# Patient Record
Sex: Male | Born: 1980 | Race: White | Hispanic: No | Marital: Single | Smoking: Heavy tobacco smoker
Health system: Western US, Community
[De-identification: ages and names within clinical notes are randomized; demographics above are authoritative.]

## PROBLEM LIST (undated history)

## (undated) HISTORY — PX: TONSILLECTOMY: SUR1361

---

## 2014-04-04 ENCOUNTER — Inpatient Hospital Stay
Admit: 2014-04-04 | Discharge: 2014-04-04 | Disposition: A | Discharge status: Home / Self Care | Payer: PRIVATE HEALTH INSURANCE | Attending: MD

## 2014-04-04 DIAGNOSIS — F1020 Alcohol dependence, uncomplicated: Principal | ICD-10-CM

## 2014-04-04 LAB — CBC WITH AUTO DIFFERENTIAL
Basophils %: 1 % (ref 0–2)
Basophils, Absolute: 0.1 10*3/uL (ref 0–0.2)
Eosinophils %: 6 % (ref 0–7)
Eosinophils, Absolute: 0.5 10*3/uL (ref 0–0.7)
HCT: 44.9 % (ref 42.0–54.0)
Hgb: 15.2 gm/dL (ref 14.0–18.0)
Lymphocytes %: 27 % (ref 25–45)
Lymphocytes, Absolute: 2.1 10*3/uL (ref 1.1–4.3)
MCH: 32 pg (ref 27.0–34.0)
MCHC: 34 gm/dL (ref 32.0–36.0)
MCV: 94.3 fL (ref 81.0–99.0)
MPV: 7.4 fL (ref 7.4–10.4)
Monocytes %: 8 % (ref 0–12)
Monocytes, Absolute: 0.6 10*3/uL (ref 0–1.2)
Neutrophils, Absolute: 4.8 10*3/uL (ref 1.6–7.3)
Platelet Count: 355 10*3/uL (ref 150–400)
RBC: 4.76 10*6/uL (ref 4.70–6.10)
RDW: 14 % (ref 11.5–14.5)
Segs (Neutrophils)%: 58 % (ref 35–70)
WBC: 8.1 10*3/uL (ref 4.8–10.8)

## 2014-04-04 LAB — URINALYSIS, ROUTINE
Bilirubin, urine: NEGATIVE
Blood, urine: NEGATIVE
Glucose, UA: NEGATIVE mg/dl
Ketones, urine: NEGATIVE
Leukocyte Esterase, urine: NEGATIVE
Nitrite, urine: NEGATIVE
Protein, urine: NEGATIVE
Specific Gravity, urine: 1.008 (ref 1.003–1.030)
Urobilinogen, urine: NEGATIVE EU/dL
pH, UA: 5.5 (ref 5.0–8.5)

## 2014-04-04 LAB — COMPREHENSIVE METABOLIC PANEL
ALT - Alanine Amino transferase: 33 IU/L (ref 7–52)
AST - Aspartate Aminotransferase: 34 IU/L (ref 10–50)
Albumin/Globulin Ratio: 1.3 (ref >0.9)
Albumin: 4.2 gm/dL (ref 3.5–5.0)
Alkaline Phosphatase: 83 IU/L (ref 34–104)
Anion Gap: 8 mmol/L (ref 3–11)
BUN: 7 mg/dL (ref 6.0–23.0)
Bilirubin, Total: 0.5 mg/dL (ref 0.3–1.2)
CO2 - Carbon Dioxide: 29 mmol/L (ref 21.0–31.0)
Calcium: 9.7 mg/dL (ref 8.6–10.3)
Chloride: 101 mmol/L (ref 98–111)
Creatinine, Serum: 0.86 mg/dL (ref 0.65–1.30)
GFR Estimate: >60 mL/min/{1.73_m2} (ref >60)
Globulin: 3.2 gm/dL (ref 2.2–3.7)
Glucose: 88 mg/dL (ref 80–99)
Potassium: 4.6 mmol/L (ref 3.5–5.1)
Protein, Total: 7.4 gm/dL (ref 6.0–8.0)
Sodium: 138 mmol/L (ref 135–143)

## 2014-04-04 LAB — DRUG SCREEN PANEL 8
Amphetamine: POSITIVE
Barbiturates: NEGATIVE
Benzodiazepines: POSITIVE
Cocaine: NEGATIVE
Creatinine Drug Screen: 89 mg/dL
Marijuana: POSITIVE
Methadone Screen, urine: NEGATIVE
Opiates: NEGATIVE
Oxycodone: NEGATIVE

## 2014-04-04 LAB — SALICYLATE LEVEL: Salicylate Level: <2.5 mg/dL — ABNORMAL LOW (ref 10.0–30.0)

## 2014-04-04 LAB — ACETAMINOPHEN LEVEL: Acetaminophen Level: <10 microgm/mL — ABNORMAL LOW (ref 10–30)

## 2014-04-04 LAB — ALCOHOL, SERUM: Alcohol Scrn: 76 mg/dL — ABNORMAL HIGH (ref <10)

## 2014-04-04 MED ORDER — LORazepam (ATIVAN) tablet 1 mg
1 | Freq: Once | ORAL | Status: AC
Start: 2014-04-04 — End: 2014-04-04
  Administered 2014-04-04: 16:00:00 1 mg via ORAL

## 2014-04-04 MED FILL — LORAZEPAM 1 MG TABLET: 1 mg | ORAL | Qty: 1

## 2014-04-04 NOTE — ED Notes (Signed)
Pt admits to taking multiple drugs within the last week to include lots of acid, meth, cocaine, pills, and at least 10 beers daily average with additional consumption this week.

## 2014-04-04 NOTE — Discharge Instructions (Signed)
Thank you for coming to [facility] today. You were seen and evaluated for alcohol abuse. Please follow the below recommendations:    - You need to cut back on your alcohol consumption. Alcohol can cause both emergent medical problems as well as tragic long-term complications like liver failure and death. It can also cause serious social problems.    - Alcohol withdrawal can be life threatening. You cannot quit alcohol cold turkey. You will need to gradually taper off of the amount of alcohol you drink or you could have severe withdrawal symptoms.    - Return to the ER if you are having any thoughts of wanting to hurt yourself or others, if you are hearing or seeing things that aren't there, are having severe tremors or any other symptoms of concern.       Thank you.  Falicia Lizotte, MD

## 2014-04-04 NOTE — ED Notes (Signed)
Patient has been walking around for 8 days and has not slept. He feels that he is detoxing from alcohol. Last drink was at at 0200. He states that he is having auditory hallucinations. He denies thoughts or intent of harming himself or others.

## 2014-04-04 NOTE — ED Notes (Signed)
Pt states he is feeling better. Requested urine from pt once again. Pt states he will try to void per urinal.

## 2014-04-04 NOTE — ED Provider Notes (Signed)
History   Dennis Palmer is a 34 y.o. year old male presenting with Medical Sreening  .    HPI     The patient is a 34 year old male who is presenting with possible withdrawal. The patient states that for the past week he has had not slept. He states that he lives in [place], but was planning to go to [place] this weekend but states that did not work out. He cannot tell me why he is in this area. He is not from here. He states that for the past week he has been going to numerous parties and using whatever drugs he can get his hands on. He states that he has used ketamine, LSD, cocaine, meth and various pills. He also states that he intermittently uses IV drugs. He is vague about the last time he used any drugs. He denies any intentional ingestions, suicidal ideations or homicidal ideations. He states that he was at a bar last night and came here after he finished drinking at 2 AM. He walked here. He is worried that he might have some withdrawal symptoms. He states that occasionally he has some auditory hallucinations but they're not telling him to do anything.    The patient denies any recent illness, fevers or chills. He has no headache. He has no chest pain, shortness of breath, nausea or vomiting or abdominal pain.    He is requesting some information to help with his alcohol addiction. He states that he is planning to return to [place] but is vague about when this will be.         History reviewed. No pertinent past medical history.    History reviewed. No pertinent past surgical history.    History reviewed. No pertinent family history.    History   Substance Use Topics   . Smoking status: Heavy Tobacco Smoker   . Smokeless tobacco: Not on file   . Alcohol Use: Yes      Comment: daily   Various drug use - see HPI.       Meds: None      Review of Systems   Complete 10 system review was performed and is otherwise negative apart from that mentioned in HPI.      Physical Exam   BP 133/89 mmHg  Pulse 80  Temp(Src)  36.5 ?C (97.7 ?F) (Oral)  Resp 18  SpO2 99%    Physical Exam   Constitutional: He is oriented to person, place, and time.   Disheveled.    HENT:   Head: Normocephalic and atraumatic.   Nose: Nose normal.   Mouth/Throat: Oropharynx is clear and moist. No oropharyngeal exudate.   Eyes: EOM are normal. Pupils are equal, round, and reactive to light. Right eye exhibits no discharge. Left eye exhibits no discharge.   Neck: Normal range of motion. Neck supple.   Cardiovascular: Normal rate, regular rhythm and intact distal pulses.    No murmur heard.  Pulmonary/Chest: Effort normal and breath sounds normal. No respiratory distress.   Abdominal: Soft. There is no tenderness.   Musculoskeletal: Normal range of motion. He exhibits no edema.   Neurological: He is alert and oriented to person, place, and time. He exhibits normal muscle tone. Coordination normal.   Intermitting shaking his whole body but he is alert during this and this is not true seizure activity.   Strength and sensation is grossly intact.    Skin: No rash noted. He is not diaphoretic.   Psychiatric: His  mood appears anxious. His speech is delayed. He is slowed and withdrawn. He is not actively hallucinating. He expresses no homicidal and no suicidal ideation. He expresses no suicidal plans and no homicidal plans.   Nursing note and vitals reviewed.      ED Course     Results for orders placed or performed during the hospital encounter of 04/04/14 (from the past 24 hour(s))   Alcohol Medical/Serum -STAT    Collection Time: 04/04/14  8:09 AM   Result Value Ref Range    Alcohol Scrn 76 (H) <10 mg/dL   CBC with Auto Differential -STAT    Collection Time: 04/04/14  8:09 AM   Result Value Ref Range    WBC 8.1 4.8 - 10.8 K/microL    RBC 4.76 4.70 - 6.10 M/microL    Hgb 15.2 14.0 - 18.0 gm/dL    HCT 16.1 09.6 - 04.5 %    MCV 94.3 81.0 - 99.0 fL    MCH 32.0 27.0 - 34.0 pg    MCHC 34.0 32.0 - 36.0 gm/dL    RDW 40.9 81.1 - 91.4 %    Platelet Count 355 150 - 400  K/microL    MPV 7.4 7.4 - 10.4 fL    Differential Type AUTO     Segs (Neutrophils)% 58 35 - 70 %    Lymphocytes % 27 25 - 45 %    Monocytes % 8 0 - 12 %    Eosinophils % 6 0 - 7 %    Basophils % 1 0 - 2 %    Segs (Neutrophils), Absolute 4.8 1.6 - 7.3 K/microL    Lymphocytes, Absolute 2.1 1.1 - 4.3 K/microL    Monocytes, Absolute 0.6 0 - 1.2 K/microL    Eosinophils, Absolute 0.5 0 - 0.7 K/microL    Basophils, Absolute 0.1 0 - 0.2 K/microL   Comprehensive Metabolic Panel -STAT    Collection Time: 04/04/14  8:09 AM   Result Value Ref Range    Sodium 138 135 - 143 mmol/L    Potassium 4.6 3.5 - 5.1 mmol/L    Chloride 101 98 - 111 mmol/L    CO2 - Carbon Dioxide 29.0 21.0 - 31.0 mmol/L    Glucose 88 80 - 99 mg/dL    BUN 7 6.0 - 78.2 mg/dL    Creatinine, Serum 9.56 0.65 - 1.30 mg/dL    Calcium 9.7 8.6 - 21.3 mg/dL    AST - Aspartate Aminotransferase 34 10 - 50 IU/L    ALT - Alanine Amino transferase 33 7 - 52 IU/L    Alkaline Phosphatase 83 34 - 104 IU/L    Bilirubin, Total 0.5 0.3 - 1.2 mg/dL    Protein, Total 7.4 6.0 - 8.0 gm/dL    Albumin 4.2 3.5 - 5.0 gm/dL    Globulin 3.2 2.2 - 3.7 gm/dL    Albumin/Globulin Ratio 1.3 >0.9    Anion Gap 8.0 3 - 11 mmol/L    GFR Estimate >60 >60 mL/min/1.73sq.m    GFR Additional Info                                                    Salicylates -STAT    Collection Time: 04/04/14  8:09 AM   Result Value Ref Range    Salicylate Level <2.5 (L) 10.0 - 30.0 mg/dL  Acetaminophen -STAT    Collection Time: 04/04/14  8:09 AM   Result Value Ref Range    Acetaminophen Level <10.0 (L) 10 - 30 microgm/mL   Drugs of Abuse Panel 8, urine -STAT    Collection Time: 04/04/14  8:52 AM   Result Value Ref Range    Benzodiazepines POSITIVE     Cocaine NEGATIVE     Amphetamine POSITIVE     Marijuana POSITIVE     Opiates NEGATIVE     Barbiturates NEGATIVE     Methadone Screen, urine NEGATIVE     Oxycodone NEGATIVE     Creatinine Drug Screen 89 mg/dL   Urinalysis, Routine -STAT    Collection Time: 04/04/14   8:52 AM   Result Value Ref Range    Urine Color YELLOW     Urine Character CLEAR     Glucose, urine NEGATIVE NEGATIVE mg/dl    Bilirubin, urine NEGATIVE NEGATIVE    Ketones, urine NEGATIVE NEGATIVE    Specific Gravity, urine 1.008 1.003 - 1.030    Blood, urine NEGATIVE NEGATIVE    pH, UA 5.5 5.0 - 8.5    Protein, urine NEGATIVE NEGATIVE    Urobilinogen, urine NEGATIVE NEGATIVE EU/dL    Nitrite, urine NEGATIVE NEGATIVE    Leukocyte Esterase, urine NEGATIVE NEGATIVE         Procedures    MDM   The patient was triaged room 6 per nursing notes and vitals were reviewed. This is a 34 year old male who is presenting with possible withdrawal symptoms. When further discussing this with the patient, he tells me that he actually was drinking up until 2:00 last night. He has been using drugs regularly over the past week. He is not from this area. He denies any suicidal or homicidal ideations. The patient's blood pressure and heart rate are not elevated and he does not appear to be having actual withdrawal symptoms. He does appear quite anxious and was given 1 mg of oral Ativan for this. He is requesting information about rehabilitation although is vague about his plan to quit. He otherwise denies any other medical problems currently.    I did obtain labs and observed the patient in the emergency room for a few hours. He remains hemodynamically stable. His call level is 76. A urine drug screen is positive for marijuana and benzodiazepines (did give this here in the ER). CBC/CMP unremarkable. Tylenol and ASA are negative.     After observing the patient in the emergency room, he is feeling much better. I have provided him with resources for alcohol and drug rehabilitation. I've advised him to slowly taper off his alcohol to prevent withdrawal symptoms. The patient understands all return precautions and is discharged in a stable condition.      ED Disposition: Discharge     Diagnosis:  Alcohol abuse  Drug abuse        Rennee Coyne E.  Melvyn Neth, MD  04/04/14 [phone]

## 2017-08-18 ENCOUNTER — Inpatient Hospital Stay
Admit: 2017-08-18 | Discharge: 2017-08-18 | Disposition: A | Discharge status: Home / Self Care | Payer: Self-pay | Attending: Emergency Medicine

## 2017-08-18 DIAGNOSIS — F1010 Alcohol abuse, uncomplicated: Secondary | ICD-10-CM

## 2017-08-18 LAB — DRUG SCREEN, URINE
AMPHETAMINES: NEGATIVE
Amphetamine Screen, Urine: NEGATIVE
BARBITURATES: POSITIVE — AB
BENZODIAZEPINES: POSITIVE — AB
Barbiturate Screen, Urine: POSITIVE — AB
Benzodiazepine Screen, Urine: POSITIVE — AB
COCAINE: NEGATIVE
Cocaine Screen Urine: NEGATIVE
METHADONE: NEGATIVE
Methadone Screen, Urine: NEGATIVE
OPIATES: NEGATIVE
Opiate Screen, Urine: NEGATIVE
PCP Screen, Urine: NEGATIVE
PCP(PHENCYCLIDINE): NEGATIVE
THC (TH-CANNABINOL): NEGATIVE
THC Screen, Urine: NEGATIVE

## 2017-08-18 LAB — METABOLIC PANEL, COMPREHENSIVE
A-G Ratio: 1.1 (ref 1.1–2.2)
ALT (SGPT): 211 U/L — ABNORMAL HIGH (ref 12–78)
AST (SGOT): 159 U/L — ABNORMAL HIGH (ref 15–37)
Albumin: 3.7 g/dL (ref 3.5–5.0)
Alk. phosphatase: 64 U/L (ref 45–117)
Anion gap: 8 mmol/L (ref 5–15)
BUN/Creatinine ratio: 21 — ABNORMAL HIGH (ref 12–20)
BUN: 20 MG/DL (ref 6–20)
Bilirubin, total: 0.8 MG/DL (ref 0.2–1.0)
CO2: 26 mmol/L (ref 21–32)
Calcium: 8.2 MG/DL — ABNORMAL LOW (ref 8.5–10.1)
Chloride: 109 mmol/L — ABNORMAL HIGH (ref 97–108)
Creatinine: 0.96 MG/DL (ref 0.70–1.30)
GFR est AA: >60 mL/min/{1.73_m2} (ref >60)
GFR est non-AA: >60 mL/min/{1.73_m2} (ref >60)
Globulin: 3.3 g/dL (ref 2.0–4.0)
Glucose: 105 mg/dL — ABNORMAL HIGH (ref 65–100)
Potassium: 3.7 mmol/L (ref 3.5–5.1)
Protein, total: 7 g/dL (ref 6.4–8.2)
Sodium: 143 mmol/L (ref 136–145)

## 2017-08-18 LAB — CBC WITH AUTOMATED DIFF
ABS. BASOPHILS: 0.1 10*3/uL (ref 0.0–0.1)
ABS. EOSINOPHILS: 0.4 10*3/uL (ref 0.0–0.4)
ABS. IMM. GRANS.: 0 10*3/uL (ref 0.00–0.04)
ABS. LYMPHOCYTES: 3.3 10*3/uL (ref 0.8–3.5)
ABS. MONOCYTES: 0.7 10*3/uL (ref 0.0–1.0)
ABS. NEUTROPHILS: 4 10*3/uL (ref 1.8–8.0)
ABSOLUTE NRBC: 0 10*3/uL (ref 0.00–0.01)
BASOPHILS: 1 % (ref 0–1)
EOSINOPHILS: 5 % (ref 0–7)
HCT: 40.4 % (ref 36.6–50.3)
HGB: 14.5 g/dL (ref 12.1–17.0)
IMMATURE GRANULOCYTES: 0 % (ref 0.0–0.5)
LYMPHOCYTES: 39 % (ref 12–49)
MCH: 32.3 PG (ref 26.0–34.0)
MCHC: 35.9 g/dL (ref 30.0–36.5)
MCV: 90 FL (ref 80.0–99.0)
MONOCYTES: 8 % (ref 5–13)
MPV: 9 FL (ref 8.9–12.9)
NEUTROPHILS: 47 % (ref 32–75)
NRBC: 0 PER 100 WBC
PLATELET: 325 10*3/uL (ref 150–400)
RBC: 4.49 M/uL (ref 4.10–5.70)
RDW: 12.6 % (ref 11.5–14.5)
WBC: 8.5 10*3/uL (ref 4.1–11.1)

## 2017-08-18 LAB — ETHYL ALCOHOL
ALCOHOL(ETHYL),SERUM: 179 MG/DL — ABNORMAL HIGH (ref <10)
Ethyl Alcohol: 179 MG/DL — ABNORMAL HIGH (ref <10)

## 2017-08-18 LAB — LIPASE
Lipase: 120 U/L (ref 73–393)
Lipase: 120 U/L (ref 73–393)

## 2017-08-18 LAB — SAMPLES BEING HELD

## 2017-08-18 LAB — COMPREHENSIVE METABOLIC PANEL
ALT: 211 U/L — ABNORMAL HIGH (ref 12–78)
AST: 159 U/L — ABNORMAL HIGH (ref 15–37)
Albumin/Globulin Ratio: 1.1 (ref 1.1–2.2)
Albumin: 3.7 g/dL (ref 3.5–5.0)
Alkaline Phosphatase: 64 U/L (ref 45–117)
Anion Gap: 8 mmol/L (ref 5–15)
BUN: 20 MG/DL (ref 6–20)
Bun/Cre Ratio: 21 — ABNORMAL HIGH (ref 12–20)
CO2: 26 mmol/L (ref 21–32)
Calcium: 8.2 MG/DL — ABNORMAL LOW (ref 8.5–10.1)
Chloride: 109 mmol/L — ABNORMAL HIGH (ref 97–108)
Creatinine: 0.96 MG/DL (ref 0.70–1.30)
EGFR IF NonAfrican American: >60 mL/min/{1.73_m2} (ref >60)
GFR African American: >60 mL/min/{1.73_m2} (ref >60)
Globulin: 3.3 g/dL (ref 2.0–4.0)
Glucose: 105 mg/dL — ABNORMAL HIGH (ref 65–100)
Potassium: 3.7 mmol/L (ref 3.5–5.1)
Sodium: 143 mmol/L (ref 136–145)
Total Bilirubin: 0.8 MG/DL (ref 0.2–1.0)
Total Protein: 7 g/dL (ref 6.4–8.2)

## 2017-08-18 LAB — CBC WITH AUTO DIFFERENTIAL
Basophils %: 1 % (ref 0–1)
Basophils Absolute: 0.1 10*3/uL (ref 0.0–0.1)
Eosinophils %: 5 % (ref 0–7)
Eosinophils Absolute: 0.4 10*3/uL (ref 0.0–0.4)
Granulocyte Absolute Count: 0 10*3/uL (ref 0.00–0.04)
Hematocrit: 40.4 % (ref 36.6–50.3)
Hemoglobin: 14.5 g/dL (ref 12.1–17.0)
Immature Granulocytes: 0 % (ref 0.0–0.5)
Lymphocytes %: 39 % (ref 12–49)
Lymphocytes Absolute: 3.3 10*3/uL (ref 0.8–3.5)
MCH: 32.3 PG (ref 26.0–34.0)
MCHC: 35.9 g/dL (ref 30.0–36.5)
MCV: 90 FL (ref 80.0–99.0)
MPV: 9 FL (ref 8.9–12.9)
Monocytes %: 8 % (ref 5–13)
Monocytes Absolute: 0.7 10*3/uL (ref 0.0–1.0)
NRBC Absolute: 0 10*3/uL (ref 0.00–0.01)
Neutrophils %: 47 % (ref 32–75)
Neutrophils Absolute: 4 10*3/uL (ref 1.8–8.0)
Nucleated RBCs: 0 PER 100 WBC
Platelets: 325 10*3/uL (ref 150–400)
RBC: 4.49 M/uL (ref 4.10–5.70)
RDW: 12.6 % (ref 11.5–14.5)
WBC: 8.5 10*3/uL (ref 4.1–11.1)

## 2017-08-18 MED ORDER — CHLORDIAZEPOXIDE 25 MG CAP
25 mg | ORAL_CAPSULE | Freq: Three times a day (TID) | ORAL | 0 refills | Status: AC | PRN
Start: 2017-08-18 — End: ?

## 2017-08-18 MED ORDER — SODIUM CHLORIDE 0.9% BOLUS IV
0.9 % | INTRAVENOUS | Status: AC
Start: 2017-08-18 — End: 2017-08-18
  Administered 2017-08-18: 14:00:00 via INTRAVENOUS

## 2017-08-18 MED FILL — SODIUM CHLORIDE 0.9 % IV: INTRAVENOUS | Qty: 1000

## 2017-08-18 NOTE — ED Notes (Signed)
Pt arrived via EMS for ETOH problem. Pt states he was in town from [place] for a concert and missed his flight home in late June. Pt has been living on the streets. Previously the patient was sober for 90 days and relapsed on June 21, pt denies any triggering or stressful events to occu on this date, pt states "I thought I could get away with drinking because I had all my ducks in a row." Pt endorses that he has been consuming 30-40 drinks a day, starting with "4 Lokos" when he wakes up.

## 2017-08-18 NOTE — ED Notes (Signed)
Pt CIWA score elevated from 3 to 17; Pt c/o "itching all over" "headache" and "auditory hallucinations. "Hearing voices." Dr. Ladona Ridgel notified and will see pt.

## 2017-08-18 NOTE — ED Notes (Signed)
Urine specimen obtained and sent to lab.

## 2017-08-18 NOTE — Progress Notes (Signed)
08/18/2017  2:33 PM  CM spoke w/ nursing re: transport, pt is discharged, going to [facility] [address],[place], [place] for ETOH services, CM booked Lyft transport via [employer], requested 3:00 PM pick up.  CM s/w pt [place], has no family or frineds locally is seeking support for ETOH abuse.  Notified nursing.  Jacinta ShoeJeanmarie Young  Case Production designer, theatre/television/filmManager

## 2017-08-18 NOTE — ED Notes (Signed)
Assumed care of pt. Bsmart here to talk with pt.

## 2017-08-18 NOTE — ED Provider Notes (Signed)
37 y.o. male with past medical history significant for ETOH abuse who presents via EMS from the street with chief complaint of alcohol problem. Patient was found sleeping under a tree "by a gas station" prior to arrival. Patient admits to alcohol use, but notes he has no memory of events after 2300 last night. Patient presents [facility] ED via EMS intoxicated with intermittent abdominal pain, nausea, and states he "thinks" he has been vomiting. Upon examination patient states he wants "help if there is any" for alcohol dependence. Patient was in town from [place] for a concert, but missed his flight home in late June. Patient states since missing his flight home he has been living on the streets. Patient endorses h/o of alcohol abuse and was previously sober for ~90 days; however, patient relapsed on 07/25/2017 (~3 weeks ago) because he "thought he could get away with drinking because he had all of his ducks in a row." Patient endorses since he has been drinking 30-40 drinks a day, last drink was "last night." Patient endorses marijuana use, but denies illicit drug use. Patient denies fever, chills, SOB, chest pain.     There are no other acute medical concerns at this time.    Note written by Regis BillKelsey Stroud, Scribe, as dictated by Cindi Carbonaylor, Uday Jantz, MD 10:05 AM        The history is provided by the patient and the EMS personnel.        No past medical history on file.    No past surgical history on file.      No family history on file.    Social History     Socioeconomic History   ??? Marital status: Not on file     Spouse name: Not on file   ??? Number of children: Not on file   ??? Years of education: Not on file   ??? Highest education level: Not on file   Occupational History   ??? Not on file   Social Needs   ??? Financial resource strain: Not on file   ??? Food insecurity:     Worry: Not on file     Inability: Not on file   ??? Transportation needs:     Medical: Not on file     Non-medical: Not on file   Tobacco Use   ??? Smoking  status: Not on file   Substance and Sexual Activity   ??? Alcohol use: Not on file   ??? Drug use: Not on file   ??? Sexual activity: Not on file   Lifestyle   ??? Physical activity:     Days per week: Not on file     Minutes per session: Not on file   ??? Stress: Not on file   Relationships   ??? Social connections:     Talks on phone: Not on file     Gets together: Not on file     Attends religious service: Not on file     Active member of club or organization: Not on file     Attends meetings of clubs or organizations: Not on file     Relationship status: Not on file   ??? Intimate partner violence:     Fear of current or ex partner: Not on file     Emotionally abused: Not on file     Physically abused: Not on file     Forced sexual activity: Not on file   Other Topics Concern   ??? Not  on file   Social History Narrative   ??? Not on file         ALLERGIES: Patient has no allergy information on record.    Review of Systems   Constitutional: Negative for chills and fever.   Eyes: Negative for visual disturbance.   Respiratory: Negative for cough, shortness of breath and wheezing.    Cardiovascular: Negative for chest pain and leg swelling.   Gastrointestinal: Positive for abdominal pain, nausea and vomiting. Negative for diarrhea.   Genitourinary: Negative for dysuria.   Musculoskeletal: Negative.  Negative for back pain and neck stiffness.   Skin: Negative for rash.   Neurological: Negative.  Negative for syncope and headaches.   Psychiatric/Behavioral: Negative for confusion, self-injury and sleep disturbance.   All other systems reviewed and are negative.      Vitals:    08/18/17 0959   BP: (!) 134/92   Pulse: 67   Resp: 14   Temp: 97.6 ??F (36.4 ??C)   SpO2: 97%   Weight: 77.1 kg (170 lb)   Height: 6\' 1"  (1.854 m)            Physical Exam   Constitutional: He appears well-developed and well-nourished. No distress.   HENT:   Head: Normocephalic.   Eyes: Pupils are equal, round, and reactive to light.   Neck: Normal range of  motion.   Cardiovascular: Normal rate and regular rhythm.   No murmur heard.  Pulmonary/Chest: Effort normal and breath sounds normal. No respiratory distress.   Abdominal: Soft.   Mild diffuse abdominal tenderness   Musculoskeletal: Normal range of motion. He exhibits no edema.   Neurological: He is alert. He has normal strength. No cranial nerve deficit.   Skin: Skin is warm and dry.   Psychiatric: He has a normal mood and affect. His behavior is normal.   Nursing note and vitals reviewed.  Note written by Regis Bill, Scribe, as dictated by Cindi Carbon, MD 10:04 AM       MDM       Procedures  PROGRESS NOTE:  10:34 AM Spoke with BSMART, will come evaluate.     12:06 PM BSMART evaluated patient. BSMART states patient does not meet criteria for admission at this time.     PROGRESS NOTE:  1:37 PM Patient complained of intermittent auditory hallucination; however, is not having any hallucinations at this time. Patient's vital signs normal with HR 70 bpm and blood pressure 122/90. Will discharge home with Chlordiazepoxide.     1:45 PM  Patient's results have been reviewed with them.  Patient has verbally conveyed their understanding and agreement of the patient's signs, symptoms, diagnosis, treatment and prognosis and additionally agree to follow up as recommended or return to the Emergency Room should their condition change prior to follow-up.  Discharge instructions have also been provided to the patient with some educational information regarding their diagnosis as well a list of reasons why they would want to return to the ER prior to their follow-up appointment should their condition change.

## 2017-08-18 NOTE — ED Notes (Signed)
Sitter at bedside. Pt lying supine, drowsy.

## 2017-08-18 NOTE — ED Notes (Signed)
Sitter at bedside. Pt lying supine, drowsy.

## 2017-08-18 NOTE — ED Notes (Signed)
Assumed care of pt. Bsmart here to talk with pt.

## 2017-08-18 NOTE — Other (Signed)
The patient is amenable to going to the [facility] on discharge. Writer provided the address to nursing staff to schedule transportation.         Chrisandra CarotaAnna Folliard, MSW, Supervisee in Social Work

## 2017-08-18 NOTE — ED Triage Notes (Signed)
Pt arrived via EMS for ETOH problem. Pt states he was in town from [place] for a concert and missed his flight home in late June. Pt has been living on the streets. Previously the patient was sober for 90 days and relapsed on June 21, pt denies any triggering or stressful events to occu on this date, pt states "I thought I could get away with drinking because I had all my ducks in a row." Pt endorses that he has been consuming 30-40 drinks a day, starting with "4 Lokos" when he wakes up.

## 2017-08-18 NOTE — Other (Signed)
Comprehensive Assessment Form Part 1      Section I - Disposition    Axis I - Alcohol Use Disorder, Severe               Schizoaffective Disorder by history  Axis II - Deffered  Axis III - No past medical history on file.  Axis IV - substance abuse, non compliance with treatment, homelessness, isolation from family  Axis V - 45-50      The Medical Doctor to Psychiatrist conference was not completed.  The Medical Doctor is in agreement with Psychiatrist disposition because of (reason) they did not request to speak to each other.  The plan is discharge with community resouces.  The on-call Psychiatrist consulted was NP Epstien.      Section II - Integrated Summary  Summary:  Writer met with this patient face to face at [facility] Department. He stated that he is here because of alcohol withdrawal. He states that he knows the alcohol is killing him. He denied having a plan for suicide and stated "the alcohol is killing me, I don't need one". He also stated "I would like to live another 10-15 years so that my parents don't have to see me die". The patient acknowledged that if he continues to drink, he will be "shot, hit by a car, beat up, who knows". The patient denies homicidal ideation. He does endorse auditory hallucinations and visual hallucinations "when I'm trying to sleep". Kerry Conway states that he was once diagnosed with schizoaffective disorder, but, he does not believe that he has it. He has a history of psychiatric admission and stated "staying in a room and eating hydroxyzine is not helpful". He is fixated on receiving help with his withdrawal from alcohol and is seeking admission for this. He stated "I need to be medically detoxed from alcohol". The patient also states that he is choosing to live on the streets and has a house "miles away". Writer spoke with on called nurse practitioner Epstien who instructed writer to discharge this patient.  Writer spoke to ED physician Dr. Lovena Le and he agrees with the plan. Writer will provide the patient with a list of outpatient resources.     The patient has demonstrated mental capacity to provide informed consent.  The information is given by the patient.  The Chief Complaint is "I'm not too".  The Precipitant Factors are see axis IV.  Previous Hospitalizations: yes per patient  The patient has not previously been in restraints.  Current Psychiatrist and/or Case Manager is none.    Lethality Assessment:    The potential for suicide noted by the following: not noted.  The potential for homicide is not noted.  The patient has not been a perpetrator of sexual or physical abuse.  There are not pending charges.  The patient is not felt to be at risk for self harm or harm to others.  The attending nurse was advised that security has not been notified.    Section III - Psychosocial  The patient's overall mood and attitude is intoxicated and flippant.  Feelings of helplessness and hopelessness are not observed.  Generalized anxiety is not observed.  Panic is not observed. Phobias are not observed.  Obsessive compulsive tendencies are not observed.      Section IV - Mental Status Exam  The patient's appearance is unkempt and shows poor hygiene.  The patient's behavior shows no evidence of impairment. The patient is oriented to time, place, person and situation.  The patient's speech is slurred.  The patient's mood is depressed.  The range of affect is flat.  The patient's thought content demonstrates no evidence of impairment.  The thought process shows no evidence of impairment.  The patient's perception demonstrated changes in the following:  auditory hallucinations. The patient's memory shows no evidence of impairment.  The patient's appetite shows no evidence of impairment.  The patient's sleep has evidence of insomnia. The patient shows no insight.  The patient's judgement shows no evidence of impairment.           Section V - Substance Abuse  The patient is using substances.  The patient is using alcohol for greater than 10 years with last use on last night. The patient has experienced the following withdrawal symptoms: tremors and seizures.      Section VI - Living Arrangements  The patient is single.  The patient lives alone. The patient has no children.  The patient does not plan to return home upon discharge.  The patient does not have legal issues pending. The patient's source of income the patient claims he has no income, however, he allegedly owns a house and buys all the alcohol he consumes.  Religious and cultural practices have not been voiced at this time.    The patient's greatest support comes from "no one" and this person will not be involved with the treatment.    The patient has not been in an event described as horrible or outside the realm of ordinary life experience either currently or in the past.  The patient has not been a victim of sexual/physical abuse.    Section VII - Other Areas of Clinical Concern  The highest grade achieved is not assessed with the overall quality of school experience being described as not assessed.  The patient is currently unemployed and speaks [place] as a primary language.  The patient has no communication impairments affecting communication. The patient's preference for learning can be described as: can read and write adequately.  The patient's hearing is normal.  The patient's vision is normal.      Kerry Conway, MSW, Supervisee in Social Work

## 2017-08-18 NOTE — ED Notes (Signed)
Pt CIWA score elevated from 3 to 17; Pt c/o "itching all over" "headache" and "auditory hallucinations. "Hearing voices." Dr. Taylor notified and will see pt.

## 2017-08-18 NOTE — Progress Notes (Signed)
08/18/2017  2:33 PM  CM spoke w/ nursing re: transport, pt is discharged, going to [facility] [address],[place], [place] for ETOH services, CM booked Ly[employer] transport via [employer], requested 3:00 PM pick up.  CM s/w [place], [place] no family or frineds locally is seeking support for ETOH abuse.  Notified nursing.  Jeanmarie Young  Case Manager

## 2017-08-18 NOTE — Other (Addendum)
Spoke with Dr. Ladona Ridgelaylor regarding patient. Patient reports drinking 30-40 drinks a day and that he has been drinking for 3 weeks since relapse. He also reports stomach pain. Will wait for alcohol and lipase levels to come back first as elevated lipase may need a medical admission per Dr. Ladona Ridgelaylor. Patient also reported to nurse that he was suicidal although he denied with Dr. Ladona Ridgelaylor. Dr. Ladona Ridgelaylor is in agreement with plan to wait on evaluation until laps have resulted. Will continue to follow.    Received call from Dr. Ladona Ridgelaylor that patient is medically cleared. Will have oncoming BSMART counselor see him face to face.     Melissa Schall LPC CSAC

## 2017-08-18 NOTE — ED Provider Notes (Signed)
37 y.o. male with past medical history significant for ETOH abuse who presents via EMS from the street with chief complaint of alcohol problem. Patient was found sleeping under a tree "by a gas station" prior to arrival. Patient admits to alcohol use, but notes he has no memory of events after 2300 last night. Patient presents to [facility] ED via EMS intoxicated with intermittent abdominal pain, nausea, and states he "thinks" he has been vomiting. Upon examination patient states he wants "help if there is any" for alcohol dependence. Patient was in town from [place] for a concert, but missed his flight home in late June. Patient states since missing his flight home he has been living on the streets. Patient endorses h/o of alcohol abuse and was previously sober for ~90 days; however, patient relapsed on 07/25/2017 (~3 weeks ago) because he "thought he could get away with drinking because he had all of his ducks in a row." Patient endorses since he has been drinking 30-40 drinks a day, last drink was "last night." Patient endorses marijuana use, but denies illicit drug use. Patient denies fever, chills, SOB, chest pain.     There are no other acute medical concerns at this time.    Note written by Regis Bill, Scribe, as dictated by Cindi Carbon, MD 10:05 AM        The history is provided by the patient and the EMS personnel.        No past medical history on file.    No past surgical history on file.      No family history on file.    Social History     Socioeconomic History   ??? Marital status: Not on file     Spouse name: Not on file   ??? Number of children: Not on file   ??? Years of education: Not on file   ??? Highest education level: Not on file   Occupational History   ??? Not on file   Social Needs   ??? Financial resource strain: Not on file   ??? Food insecurity:     Worry: Not on file     Inability: Not on file   ??? Transportation needs:     Medical: Not on file     Non-medical: Not on file   Tobacco Use    ??? Smoking status: Not on file   Substance and Sexual Activity   ??? Alcohol use: Not on file   ??? Drug use: Not on file   ??? Sexual activity: Not on file   Lifestyle   ??? Physical activity:     Days per week: Not on file     Minutes per session: Not on file   ??? Stress: Not on file   Relationships   ??? Social connections:     Talks on phone: Not on file     Gets together: Not on file     Attends religious service: Not on file     Active member of club or organization: Not on file     Attends meetings of clubs or organizations: Not on file     Relationship status: Not on file   ??? Intimate partner violence:     Fear of current or ex partner: Not on file     Emotionally abused: Not on file     Physically abused: Not on file     Forced sexual activity: Not on file   Other Topics Concern   ??? Not  on file   Social History Narrative   ??? Not on file         ALLERGIES: Patient has no allergy information on record.    Review of Systems   Constitutional: Negative for chills and fever.   Eyes: Negative for visual disturbance.   Respiratory: Negative for cough, shortness of breath and wheezing.    Cardiovascular: Negative for chest pain and leg swelling.   Gastrointestinal: Positive for abdominal pain, nausea and vomiting. Negative for diarrhea.   Genitourinary: Negative for dysuria.   Musculoskeletal: Negative.  Negative for back pain and neck stiffness.   Skin: Negative for rash.   Neurological: Negative.  Negative for syncope and headaches.   Psychiatric/Behavioral: Negative for confusion, self-injury and sleep disturbance.   All other systems reviewed and are negative.      Vitals:    08/18/17 0959   BP: (!) 134/92   Pulse: 67   Resp: 14   Temp: 97.6 ??F (36.4 ??C)   SpO2: 97%   Weight: 77.1 kg (170 lb)   Height: 6\' 1"  (1.854 m)            Physical Exam   Constitutional: He appears well-developed and well-nourished. No distress.   HENT:   Head: Normocephalic.   Eyes: Pupils are equal, round, and reactive to light.    Neck: Normal range of motion.   Cardiovascular: Normal rate and regular rhythm.   No murmur heard.  Pulmonary/Chest: Effort normal and breath sounds normal. No respiratory distress.   Abdominal: Soft.   Mild diffuse abdominal tenderness   Musculoskeletal: Normal range of motion. He exhibits no edema.   Neurological: He is alert. He has normal strength. No cranial nerve deficit.   Skin: Skin is warm and dry.   Psychiatric: He has a normal mood and affect. His behavior is normal.   Nursing note and vitals reviewed.  Note written by Regis BillKelsey Stroud, Scribe, as dictated by Cindi Carbonaylor, Jannae Fagerstrom, MD 10:04 AM       MDM       Procedures  PROGRESS NOTE:  10:34 AM Spoke with BSMART, will come evaluate.     12:06 PM BSMART evaluated patient. BSMART states patient does not meet criteria for admission at this time.     PROGRESS NOTE:  1:37 PM Patient complained of intermittent auditory hallucination; however, is not having any hallucinations at this time. Patient's vital signs normal with HR 70 bpm and blood pressure 122/90. Will discharge home with Chlordiazepoxide.     1:45 PM  Patient's results have been reviewed with them.  Patient has verbally conveyed their understanding and agreement of the patient's signs, symptoms, diagnosis, treatment and prognosis and additionally agree to follow up as recommended or return to the Emergency Room should their condition change prior to follow-up.  Discharge instructions have also been provided to the patient with some educational information regarding their diagnosis as well a list of reasons why they would want to return to the ER prior to their follow-up appointment should their condition change.

## 2018-01-25 ENCOUNTER — Emergency Department (HOSPITAL_COMMUNITY): Payer: Self-pay

## 2018-01-25 ENCOUNTER — Encounter (HOSPITAL_COMMUNITY): Payer: Self-pay | Admitting: Emergency Medicine

## 2018-01-25 ENCOUNTER — Emergency Department (HOSPITAL_COMMUNITY)
Admission: EM | Admit: 2018-01-25 | Discharge: 2018-01-25 | Disposition: A | Discharge status: Home / Self Care | Payer: Self-pay | Attending: Emergency Medicine | Admitting: Emergency Medicine

## 2018-01-25 DIAGNOSIS — R05 Cough: Secondary | ICD-10-CM | POA: Insufficient documentation

## 2018-01-25 DIAGNOSIS — F1010 Alcohol abuse, uncomplicated: Secondary | ICD-10-CM | POA: Insufficient documentation

## 2018-01-25 DIAGNOSIS — F17210 Nicotine dependence, cigarettes, uncomplicated: Secondary | ICD-10-CM | POA: Insufficient documentation

## 2018-01-25 DIAGNOSIS — R0602 Shortness of breath: Secondary | ICD-10-CM | POA: Insufficient documentation

## 2018-01-25 DIAGNOSIS — R059 Cough, unspecified: Secondary | ICD-10-CM

## 2018-01-25 DIAGNOSIS — J3489 Other specified disorders of nose and nasal sinuses: Secondary | ICD-10-CM | POA: Insufficient documentation

## 2018-01-25 MED ORDER — CHLORDIAZEPOXIDE HCL 25 MG PO CAPS
50.0000 mg | ORAL_CAPSULE | Freq: Once | ORAL | Status: AC
  Administered 2018-01-25: 50 mg via ORAL
  Filled 2018-01-25: qty 2

## 2018-01-25 MED ORDER — BENZONATATE 100 MG PO CAPS: 100.0000 mg | ORAL_CAPSULE | Freq: Three times a day (TID) | ORAL | 0 refills | Status: AC | PRN

## 2018-01-25 MED ORDER — ALBUTEROL SULFATE HFA 108 (90 BASE) MCG/ACT IN AERS
2.0000 | INHALATION_SPRAY | Freq: Once | RESPIRATORY_TRACT | Status: AC
  Administered 2018-01-25: 2 via RESPIRATORY_TRACT
  Filled 2018-01-25: qty 6.7

## 2018-01-25 MED ORDER — CHLORDIAZEPOXIDE HCL 25 MG PO CAPS: ORAL_CAPSULE | ORAL | 0 refills | Status: AC

## 2018-01-25 MED ORDER — IBUPROFEN 200 MG PO TABS
600.0000 mg | ORAL_TABLET | Freq: Once | ORAL | Status: AC
  Administered 2018-01-25: 600 mg via ORAL
  Filled 2018-01-25: qty 3

## 2018-01-25 NOTE — ED Triage Notes (Signed)
Pt c/o body aches, chills, productive cough for couple weeks. Pt also requesting detox from ETOH. Last drink around 2 am this morning. Pt drinks case beer and or 5th liquor daily.

## 2018-01-25 NOTE — ED Provider Notes (Signed)
[facility]-EMERGENCY DEPT Provider Note   CSN: [id number] Arrival date & time: 01/25/18  [id number]     History   Chief Complaint Chief Complaint  Patient presents with  . Cough  . Generalized Body Aches  . detox    HPI Wesley Robbins is a 37 y.o. male.  HPI  37 year old male presents with cough.  The patient states he is been having cough with yellow/green sputum in addition to rhinorrhea, chills and body aches.  This is been ongoing for about 3 weeks.  No fever that he knows of.  Sometimes he is short of breath but he denies any chest pain.  Sometimes he will cough so hard that he vomits.  No leg swelling.  He has been to multiple hospitals for the same.  The patient states [place] in [place] for similar cough complaints and wanting detox.  He states he drinks both a case and 1/5/day.  Last drank around 1 AM.  He is looking for alcohol detox.  He states he is been abusing alcohol for 20+ years.  Has been through detox in the past, most recently in April.  He denies any hallucinations, suicidal thoughts or homicidal thoughts.  History reviewed. No pertinent past medical history.  There are no active problems to display for this patient.   Past Surgical History:  Procedure Laterality Date  . TONSILLECTOMY          Home Medications    Prior to Admission medications   Medication Sig Start Date End Date Taking? Authorizing Provider  benzonatate (TESSALON) 100 MG capsule Take 1 capsule (100 mg total) by mouth 3 (three) times daily as needed for cough. 01/25/18   Pricilla LovelessGoldston, Jeny Nield, MD  chlordiazePOXIDE (LIBRIUM) 25 MG capsule 50mg  PO TID x 1D, then 25-50mg  PO BID X 1D, then 25-50mg  PO QD X 1D 01/25/18   Pricilla LovelessGoldston, Ercilia Bettinger, MD    Family History No family history on file.  Social History Social History   Tobacco Use  . Smoking status: Current Every Day Smoker    Types: Cigarettes  . Smokeless tobacco: Never Used  Substance Use Topics  . Alcohol use:  Yes  . Drug use: Not on file     Allergies   Sulfa antibiotics   Review of Systems Review of Systems  Constitutional: Negative for fever.  HENT: Positive for rhinorrhea.   Respiratory: Positive for cough and shortness of breath.   Cardiovascular: Negative for chest pain.  Gastrointestinal: Positive for vomiting (post-tussive).  Psychiatric/Behavioral: Negative for suicidal ideas.  All other systems reviewed and are negative.    Physical Exam Updated Vital Signs BP 131/81 (BP Location: Right Arm)   Pulse 100   Temp 98.1 F (36.7 C) (Oral)   Resp 20   SpO2 98%   Physical Exam Vitals signs and nursing note reviewed.  Constitutional:      General: He is not in acute distress.    Appearance: He is well-developed. He is not ill-appearing or diaphoretic.     Comments: Does not appear clinically intoxicated.  HENT:     Head: Normocephalic and atraumatic.     Right Ear: External ear normal.     Left Ear: External ear normal.     Nose: Nose normal.     Mouth/Throat:     Pharynx: No oropharyngeal exudate or posterior oropharyngeal erythema.  Eyes:     General:        Right eye: No discharge.  Left eye: No discharge.  Neck:     Musculoskeletal: Neck supple.  Cardiovascular:     Rate and Rhythm: Normal rate and regular rhythm.     Heart sounds: Normal heart sounds.  Pulmonary:     Effort: Pulmonary effort is normal.     Breath sounds: Normal breath sounds. No stridor. No wheezing or rales.  Abdominal:     Palpations: Abdomen is soft.     Tenderness: There is no abdominal tenderness.  Skin:    General: Skin is warm and dry.  Neurological:     Mental Status: He is alert.  Psychiatric:        Attention and Perception: He does not perceive auditory or visual hallucinations.        Mood and Affect: Mood is not anxious.        Speech: Speech is not slurred.        Thought Content: Thought content does not include homicidal or suicidal ideation.      ED  Treatments / Results  Labs (all labs ordered are listed, but only abnormal results are displayed) Labs Reviewed - No data to display  EKG None  Radiology Dg Chest 2 View  Result Date: 01/25/2018 CLINICAL DATA:  Myalgias, cough, alcohol withdrawal EXAM: CHEST - 2 VIEW COMPARISON:  None. FINDINGS: Normal heart size and vascularity. Minor streaky bibasilar densities compatible with atelectasis. No focal pneumonia, collapse or consolidation. Negative for edema, effusion or pneumothorax. Trachea is midline. No acute osseous finding. IMPRESSION: Bibasilar atelectasis.  No other acute process by plain radiography Electronically Signed   By: Judie PetitM.  Shick M.D.   On: 01/25/2018 09:18    Procedures Procedures (including critical care time)  Medications Ordered in ED Medications  ibuprofen (ADVIL,MOTRIN) tablet 600 mg (has no administration in time range)  albuterol (PROVENTIL HFA;VENTOLIN HFA) 108 (90 Base) MCG/ACT inhaler 2 puff (2 puffs Inhalation Given 01/25/18 0901)  chlordiazePOXIDE (LIBRIUM) capsule 50 mg (50 mg Oral Given 01/25/18 0901)     Initial Impression / Assessment and Plan / ED Course  I have reviewed the triage vital signs and the nursing notes.  Pertinent labs & imaging results that were available during my care of the patient were reviewed by me and considered in my medical decision making (see chart for details).     Patient does not appear significantly ill.  His vital signs are reassuring.  Given no tachycardia, hypoxia, or significant blood pressure abnormalities, I do not think labs would be beneficial.  He was seen at an outside hospital less than 1 week ago and he had labs done then that showed stable lab work.  We discussed outpatient detox options and I will prescribe him Librium, first dose given here as he is not driving.  Patient is not psychotic, suicidal, or homicidal and I do not think he needs emergent psychiatric admission for detox. He most likely has cough from  either URI or smoking. Doubt PNA, PE, ACS.  He is asking for ibuprofen or Tylenol for his body aches.  This is been ongoing for multiple weeks and given no tea colored, dark, or bloody urine I have low suspicion for rhabdomyolysis.  He appears stable for outpatient follow-up.  We discussed return precautions as well as counseled on smoking cessation.  Final Clinical Impressions(s) / ED Diagnoses   Final diagnoses:  Cough  Alcohol abuse    ED Discharge Orders         Ordered    benzonatate (TESSALON) 100  MG capsule  3 times daily PRN     01/25/18 0935    chlordiazePOXIDE (LIBRIUM) 25 MG capsule     01/25/18 0935           Pricilla Loveless, MD 01/25/18 [id number]

## 2018-01-25 NOTE — Discharge Instructions (Addendum)
You may use the albuterol inhaler 1-2 puffs every 4 hours as needed for cough or shortness of breath.  Do not take the Librium prescribed to you while driving or operating heavy machinery.  Do not combine with alcohol.  It is very important to follow-up with a detox center as this medicine does not help you detox but prevents withdrawal.  Return to the ER for any new, concerning or worsening symptoms.   Substance Abuse Treatment Programs  Intensive Outpatient Programs [facility]     [address]      [place], [place]                   [phone]       [facility] [address] [place] [address] [place], [place] [phone]  [facility]     (Inpatient and outpatient)     [address]           [phone]    [facility] [phone] (Suboxone and Methadone)  [address]      [place], [place] [address]      [phone]       [address] [place], [place] [phone]  [facility] (Outpatient/Inpatient, Chemical)    (insurance only) [phone]             [facility] (Groups & Residential) [place], [place] [phone]     [facility]     [address]     [place], [place]      [phone]       [facility] (for caregivers and family) [phone] [address] [place], [place] [phone]      Residential Treatment Programs [facility]      [address], [place], [place] [address]  [phone]       [facility] [address] [place], [place] [address] [phone]  [facility] [place]        [phone]       [facility] [phone]  [facility] ([facility]             [address]                                         [place], [place]                                                [phone] or [phone]                               [facility] [address] [place],  [address] [phone]  [facility]    [address]      [place], [place]     [phone]       [facility] [address] [place], [place] [phone]  [facility]   [address] [place], [place] [address]     [phone]      Admissions: 8am-3pm M-F  [facility] (RTS) [address] [place], [place] [phone]  [facility]: Residential Program (90 Days)  [place], [place]      [phone] or [phone]     [facility]: [facility] [place], [place] (Walk in Hours over the weekend or by referral)  [facility] [address] [place], [place], [place] [address] [phone]  Crisis Mobile: [facility]:  [phone] (for crisis response 24 hours a day) [facility] Hotline:      [phone] Outpatient Psychiatry and Counseling  [facility]: Mobile Crisis Management 24 hours:  [phone]  [facility] sliding scale fee and walk in schedule: M-F 8am-12pm/1pm-3pm [address]  [place], [place] [address] [phone]  [facility] [address] [place], [place] [address] [phone]  [facility] (Formerly known as [facility])- new patient walk-in appointments available Monday - Friday 8am -3pm.          [address] [place], [place] [address] [phone] or crisis line- [phone]  [facility] Services/ Intensive Outpatient Therapy Program [address] [place], [place] [address] [phone]  [facility]                  Crisis Services      [phone]      [address]     [place], [place] [address]                 [facility]   [facility] [phone] [address] [place], [place] [address]   [facility]          [address] [place],   [place], [place] [address]       [phone]  [facility] [address] [place], [place] [address] [phone]  [facility]    [address]    [place], [place] [address]     [phone]       Andee PolesParish McKinney, MD     [address]     [place] [address]     [phone]       [facility] [address] [place] [address]  [facility]     [address]     [place], [place]      [phone]       [facility] Eulogio DitchShamsher Ahluwalia, MD [address] [place], [place] [address] [phone]  [facility]     [address]     [place], [place] [address]     [phone]       [facility] [address] [place], [place] [address] [phone] Resources for Temporary Residential Assistance/Crisis Centers  [facility](IRC) M-F 8am-3pm   [address] [place], [place] [address]   [phone] Services include: laundry, barbering, support groups, case management, phone  & computer access, showers, AA/NA mtgs, mental health/substance abuse nurse, job skills class, disability information, VA assistance, spiritual classes, etc.   HOMELESS SHELTERS  [facility]     [facility]   [address], [place]     [phone]              [facility] (women and children)       [address] [place], [place] [address] [phone] [email or web address] for application and process Application Required  [facility]   [address]  [place] [address]     [phone]                    [facility] [address] [place], [place] [address] [phone](schedule application appt.) Application Required  [facility] (women only)    [address]     [place], [place]  [address]     [phone]      Intake starts 6pm daily Need valid ID, SSC, & Police report [facility] [place] [address] [place], [place] [phone] Application Required  [facility] (men only)     [address]      [place], [place]     [phone]       [facility] (Pregnant women only) [address] [place], [place] [phone]  [facility]      [address]      [place], [place] [address]     [phone]             [facility] [address] [place], [place] [phone] 90 day commitment/SA/Application process  [facility](men only)     [address]     [place], [place]     [phone]       Check-in at [facility]            Crisis [facility] [address] [place], [place] [address] [phone] Men/Women/Women and Children must be there by 7 pm  [facility] [place], [place] [phone]

## 2019-12-21 ENCOUNTER — Emergency Department: Admit: 2019-12-22 | Payer: Medicaid Other

## 2019-12-21 DIAGNOSIS — R079 Chest pain, unspecified: Secondary | ICD-10-CM

## 2019-12-21 NOTE — ED Triage Notes (Signed)
Pt presents to ER for CP, SOB, N/V and palpitations x 2-3 days. Pt reports heavy substance abuse in the form of x30 beers and 1 1/2 cups of moonshine daily. Last drink 35 minutes PTA. Pt also reports recent use of ketamine, LSD, Fentanyl, Cocaine and Marijuana.

## 2019-12-21 NOTE — ED Provider Notes (Signed)
Chief Complaint   Patient presents with   . Chest Pain       HPI  39 year old gentleman with a history of significant alcohol abuse as well as polysubstance abuse.  He comes in today because [place] getting [place] and he had some chest pain.  Is been ongoing for last few days.  His last alcoholic drink was 30 minutes prior to arrival.  He has no personal or family history of coronary artery disease.  He smokes a pack a day.  No prior history of significant chest or abdominal surgeries.  No falls or trauma.    No past medical history on file.    No past surgical history on file.    No family history on file.    ROS  10 point ROS negative except per HPI    Physical Exam  Vitals:    12/21/19 2255   BP: (!) 150/91   Pulse: 94   Resp: 18   Temp: 37 ?C (98.6 ?F)   SpO2: 96%       Constitutional:  Well developed, well nourished, no acute distress, non-toxic appearance   Eyes:  Extraocular movement intact, no scleral icterus.     HENT:  Atraumatic, external ears normal, nose normal, oropharynx moist.   Respiratory:  Lungs are clear to auscultation bilaterally, no wheezing, rales, or rhonchi.  No tachypnea, no accessory muscle use.  Answering questions in full sentences.  Cardiovascular: Regular rate and rhythm no murmurs rubs or gallops.    GI: Abdomen is soft, nontender, and nondistended. Bowel sounds are present.  Musculoskeletal:  No edema, no tenderness, no deformities.   Integument:  Well hydrated, no rash   Neurologic:  Alert & oriented x 3, moving all 4 extremities, sensation intact.    EKG sinus rhythm ventricular rate 90 bpm.  No ST or T wave changes consistent with ischemia.  Normal axis.  QTC 411 ms.    Results for orders placed or performed during the hospital encounter of 12/21/19   CBC with Auto Differential -STAT   Result Value Ref Range    WBC 8.6 4.8 - 10.8 10*3/?L    RBC 4.57 (L) 4.70 - 6.10 10*6/?L    Hemoglobin 15.1 12.0 - 18.0 g/dL    HCT 40.1 (L) 02.7 - 54.0 %     MCV 91.1 81.0 - 99.0 fL    MCH 33.0 27.0 - 34.0 pg    MCHC 36.2 (H) 32.0 - 36.0 g/dL    RDW 25.3 (H) 66.4 - 14.5 %    Platelet Count 312 150 - 400 10*3/?L    MPV 6.3 (L) 7.4 - 10.4 fL    Neutrophils % 61 35 - 70 %    Lymphocytes % 29 25 - 45 %    Monocytes % 8 0 - 12 %    Eosinophils % 1 0 - 7 %    Basophils % 1 0 - 2 %    Neutrophils, Absolute 5.2 1.6 - 7.3 10*3/?L    Lymphocytes, Absolute 2.5 1.1 - 4.3 10*3/?L    Monocytes, Absolute 0.7 0.0 - 1.2 10*3/?L    Eosinophils, Absolute 0.1 0.0 - 0.7 10*3/?L    Basophils, Absolute 0.1 0.0 - 0.2 10*3/?L    Differential Type Automated Differential    Comprehensive Metabolic Panel -STAT   Result Value Ref Range    Sodium 141 135 - 143 mmol/L    Potassium 3.7 3.5 - 5.1 mmol/L  Chloride 107 98 - 111 mmol/L    CO2 - Carbon Dioxide 25 21 - 31 mmol/L    Glucose 97 80 - 99 mg/dL    BUN 10 6 - 23 mg/dL    Creatinine 1.61 0.96 - 1.30 mg/dL    Calcium 8.7 8.6 - 04.5 mg/dL    AST - Aspartate Aminotransferase 20 10 - 50 IU/L    ALT - Alanine Aminotransferase 13 7 - 52 IU/L    Alkaline Phosphatase 119 (H) 34 - 104 IU/L    Bilirubin Total 0.3 0.3 - 1.2 mg/dL    Protein Total 6.6 6.0 - 8.0 g/dL    Albumin 3.6 3.5 - 5.0 g/dL    Globulin 3.0 2.2 - 3.7 g/dL    Albumin/Globulin Ratio 1.2 >0.9    Anion Gap 9 4 - 13 mmol/L    GFR Estimate Male >60 >=60 mL/min/1.85m*2    GFR Additional Info     Lipase -STAT   Result Value Ref Range    Lipase 188 (H) 10 - 60 IU/L   Troponin-I -STAT   Result Value Ref Range    Troponin I <0.03 <=0.04 ng/mL   Magnesium -STAT   Result Value Ref Range    Magnesium 1.5 (L) 1.6 - 2.4 mg/dL   Phosphorus -STAT   Result Value Ref Range    Phosphorus 3.7 2.5 - 4.5 mg/dL   Alcohol, Serum -STAT   Result Value Ref Range    Alcohol, Serum 220 (H) <10 mg/dL   Drug Screen Panel 8 -STAT Urine Urine, Void:  non-clean catch   Result Value Ref Range    Creatinine, Urine 32 >20 mg/dL    Amphetamine Negative Negative    Barbiturates Negative Negative    Benzodiazepines Negative  Negative    Cocaine Negative Negative    Marijuana Positive (A) Negative    Opiates Negative Negative    Oxycodone Negative Negative    Methadone Negative Negative   Urinalysis with Microscopic -Once Urine Urine, Void: Clean Catch (mid stream)   Result Value Ref Range    Color, Urine Straw Yellow    Clarity, Urine Clear Clear    Glucose, Urine Negative Negative mg/dL    Bilirubin, Urine Negative Negative    Ketones, Urine Negative Negative mg/dL    Specific Gravity, Urine 1.005 1.003 - 1.030    Blood, Urine Negative Negative    pH, UA 7.0 5.0 - 9.0    Protein, Urine Negative Negative mg/dL    Urobilinogen, Urine Normal Normal mg/dL    Nitrite, Urine Negative Negative    Leukocyte Esterase, Urine Negative Negative    Ascorbic Acid, Urine Negative Negative    White Blood Cell Microscopic Numeric 1 <=9 /HPF    RBC, Urine 0 <=5 /HPF    Bacteria, Urine None Seen None Seen /HPF    Squamous Epithelial, Urine 1 <=5 /HPF     2 chest x-ray is grossly unremarkable my read.  Official read is pending.  No evidence of air under the diaphragm.  No pneumothorax.  No infiltrate.  Mediastinal contour is normal.    ED Course  For the patient complaints I did the aforementioned work-up.  Here he is tachycardic, tremulous, and started to have hallucinations.  He was given 2 mg IV Ativan with good effect.  Here laboratory work as above.  Alcohol level is 220.  Troponin is undetectable.  EKG is normal.  His lipase is starting to elevate.  This could be the chest pain  and upper abdominal pain that he has been having for the last 3 days.  Overall patient is at risk for significant alcohol withdrawal and was started to have withdrawal even with an alcohol level of 220.  He is probably not going to be able to tolerate much alcohol input with his pancreatitis as well.  At this time I think he would benefit from admission to the hospitalist service for further evaluation and treatment.    DECISION TO ADMIT:   11/17 0031    Provisional  Diagnosis   1.  Alcohol withdrawal with delirium   2.  Pancreatitis    Condition on leaving the Emergency Department: Stable         Madaline Guthrie, MD  12/22/19 0111

## 2019-12-22 ENCOUNTER — Inpatient Hospital Stay
Admission: EM | Admit: 2019-12-22 | Discharge: 2019-12-22 | Disposition: A | Discharge status: Home / Self Care | Payer: Medicaid Other

## 2019-12-22 LAB — UA WITH AUTO MICRO
Ascorbic Acid, Urine: NEGATIVE
Bacteria, Urine: NONE SEEN /HPF
Bilirubin, Urine: NEGATIVE
Blood, Urine: NEGATIVE
Glucose, Urine: NEGATIVE mg/dL
Ketones, Urine: NEGATIVE mg/dL
Leukocyte Esterase, Urine: NEGATIVE
Nitrite, Urine: NEGATIVE
Protein, Urine: NEGATIVE mg/dL
RBC, Urine: 0 /HPF (ref <=5)
Specific Gravity, Urine: 1.005 (ref 1.003–1.030)
Squamous Epithelial, Urine: 1 /HPF (ref <=5)
Urobilinogen, Urine: NORMAL mg/dL
White Blood Cell Microscopic Numeric: 1 /HPF (ref <=9)
pH, UA: 7 (ref 5.0–9.0)

## 2019-12-22 LAB — SARS-COV-2 (COVID-19), SCREENING/ASYMPTOMATIC UNEXPOSED: SARS-CoV-2 (COVID-19) by RT-PCR: NOT DETECTED

## 2019-12-22 LAB — COMPREHENSIVE METABOLIC PANEL
ALT - Alanine Aminotransferase: 13 IU/L (ref 7–52)
AST - Aspartate Aminotransferase: 20 IU/L (ref 10–50)
Albumin/Globulin Ratio: 1.2 (ref >0.9)
Albumin: 3.6 g/dL (ref 3.5–5.0)
Alkaline Phosphatase: 119 IU/L — ABNORMAL HIGH (ref 34–104)
Anion Gap: 9 mmol/L (ref 4–13)
BUN: 10 mg/dL (ref 6–23)
Bilirubin Total: 0.3 mg/dL (ref 0.3–1.2)
CO2 - Carbon Dioxide: 25 mmol/L (ref 21–31)
Calcium: 8.7 mg/dL (ref 8.6–10.3)
Chloride: 107 mmol/L (ref 98–111)
Creatinine: 1.14 mg/dL (ref 0.65–1.30)
GFR Estimate Male: >60 mL/min/{1.73_m2} (ref >=60)
Globulin: 3 g/dL (ref 2.2–3.7)
Glucose: 97 mg/dL (ref 80–99)
Potassium: 3.7 mmol/L (ref 3.5–5.1)
Protein Total: 6.6 g/dL (ref 6.0–8.0)
Sodium: 141 mmol/L (ref 135–143)

## 2019-12-22 LAB — DRUG SCREEN PANEL 8
Amphetamine: NEGATIVE
Barbiturates: NEGATIVE
Benzodiazepines: NEGATIVE
Cocaine: NEGATIVE
Creatinine, Urine: 32 mg/dL (ref >20)
Marijuana: POSITIVE — AB
Methadone: NEGATIVE
Opiates: NEGATIVE
Oxycodone: NEGATIVE

## 2019-12-22 LAB — CBC WITH AUTO DIFFERENTIAL
Basophils %: 1 % (ref 0–2)
Basophils, Absolute: 0.1 10*3/??L (ref 0.0–0.2)
Eosinophils %: 1 % (ref 0–7)
Eosinophils, Absolute: 0.1 10*3/??L (ref 0.0–0.7)
HCT: 41.7 % — ABNORMAL LOW (ref 42.0–54.0)
Hemoglobin: 15.1 g/dL (ref 12.0–18.0)
Lymphocytes %: 29 % (ref 25–45)
Lymphocytes, Absolute: 2.5 10*3/??L (ref 1.1–4.3)
MCH: 33 pg (ref 27.0–34.0)
MCHC: 36.2 g/dL — ABNORMAL HIGH (ref 32.0–36.0)
MCV: 91.1 fL (ref 81.0–99.0)
MPV: 6.3 fL — ABNORMAL LOW (ref 7.4–10.4)
Monocytes %: 8 % (ref 0–12)
Monocytes, Absolute: 0.7 10*3/??L (ref 0.0–1.2)
Neutrophils %: 61 % (ref 35–70)
Neutrophils, Absolute: 5.2 10*3/??L (ref 1.6–7.3)
Platelet Count: 312 10*3/??L (ref 150–400)
RBC: 4.57 10*6/??L — ABNORMAL LOW (ref 4.70–6.10)
RDW: 14.6 % — ABNORMAL HIGH (ref 11.5–14.5)
WBC: 8.6 10*3/??L (ref 4.8–10.8)

## 2019-12-22 LAB — TROPONIN I: Troponin I: <0.03 ng/mL (ref <=0.04)

## 2019-12-22 LAB — PHOSPHORUS: Phosphorus: 3.7 mg/dL (ref 2.5–4.5)

## 2019-12-22 LAB — MAGNESIUM: Magnesium: 1.5 mg/dL — ABNORMAL LOW (ref 1.6–2.4)

## 2019-12-22 LAB — ALCOHOL, SERUM: Alcohol, Serum: 220 mg/dL — ABNORMAL HIGH (ref <10)

## 2019-12-22 LAB — LIPASE: Lipase: 188 IU/L — ABNORMAL HIGH (ref 10–60)

## 2019-12-22 MED ORDER — benzocaine-menthoL (CEPACOL) lozenge 1 lozenge
15-3.6 | Status: DC | PRN
Start: 2019-12-22 — End: 2019-12-22

## 2019-12-22 MED ORDER — thiamine (VITAMIN B-1) 100 MG tablet
100 | ORAL_TABLET | Freq: Every day | ORAL | 0 refills | Status: AC
Start: 2019-12-22 — End: 2020-01-21

## 2019-12-22 MED ORDER — phenoL (CHLORASEPTIC) 1.4 % throat spray 1-2 spray
1.4 | Status: DC | PRN
Start: 2019-12-22 — End: 2019-12-22

## 2019-12-22 MED ORDER — aluminum-mag hydroxide-simeth 200-200-20 mg/5 mL oral suspension 30 mL
200-200-20 | ORAL | Status: DC | PRN
Start: 2019-12-22 — End: 2019-12-22

## 2019-12-22 MED ORDER — chlordiazePOXIDE (LIBRIUM) capsule 25 mg
25 | Freq: Four times a day (QID) | ORAL | Status: DC
Start: 2019-12-22 — End: 2019-12-22
  Administered 2019-12-22 (×2): 25 mg via ORAL

## 2019-12-22 MED ORDER — LORazepam (ATIVAN) injection 1 mg
2 | INTRAMUSCULAR | Status: DC | PRN
Start: 2019-12-22 — End: 2019-12-22

## 2019-12-22 MED ORDER — diazePAM (VALIUM) tablet 2.5-5 mg
5 | ORAL | Status: DC | PRN
Start: 2019-12-22 — End: 2019-12-22
  Administered 2019-12-22: 14:00:00 5 mg via ORAL

## 2019-12-22 MED ORDER — famotidine (PEPCID) tablet 20 mg
20 | Freq: Two times a day (BID) | ORAL | Status: DC
Start: 2019-12-22 — End: 2019-12-22
  Administered 2019-12-22: 17:00:00 20 mg via ORAL

## 2019-12-22 MED ORDER — sodium chloride 0.9 % infusion
INTRAVENOUS | Status: DC
Start: 2019-12-22 — End: 2019-12-22
  Administered 2019-12-22: 11:00:00 via INTRAVENOUS

## 2019-12-22 MED ORDER — sodium chloride 0.9 % (NS flush) syringe 10 mL
INTRAMUSCULAR | Status: DC | PRN
Start: 2019-12-22 — End: 2019-12-22

## 2019-12-22 MED ORDER — diazePAM (VALIUM) tablet 5-10 mg
5 | ORAL | Status: DC | PRN
Start: 2019-12-22 — End: 2019-12-22

## 2019-12-22 MED ORDER — folic acid (FOLVITE) tablet 1 mg
1 | Freq: Every day | ORAL | Status: DC
Start: 2019-12-22 — End: 2019-12-22
  Administered 2019-12-22: 17:00:00 1 mg via ORAL

## 2019-12-22 MED ORDER — sodium chloride (NS) 0.9% bolus 1,000 mL
0.9 | Freq: Once | INTRAVENOUS | Status: AC
Start: 2019-12-22 — End: 2019-12-22
  Administered 2019-12-22 (×2): via INTRAVENOUS

## 2019-12-22 MED ORDER — chlordiazePOXIDE (LIBRIUM) capsule 25 mg
25 | Freq: Four times a day (QID) | ORAL | Status: DC
Start: 2019-12-22 — End: 2019-12-22

## 2019-12-22 MED ORDER — LORazepam (ATIVAN) injection 2 mg
2 | Freq: Once | INTRAMUSCULAR | Status: AC
Start: 2019-12-22 — End: 2019-12-21
  Administered 2019-12-22: 08:00:00 2 mg via INTRAVENOUS

## 2019-12-22 MED ORDER — famotidine (PEPCID) 20 MG tablet
20 | ORAL_TABLET | Freq: Two times a day (BID) | ORAL | 0 refills | Status: AC
Start: 2019-12-22 — End: 2020-01-21

## 2019-12-22 MED ORDER — nicotine polacrilex (NICORETTE) gum 2 mg
2 | BUCCAL | Status: DC | PRN
Start: 2019-12-22 — End: 2019-12-22

## 2019-12-22 MED ORDER — thiamine (Vitamin B-1) 100 mg in sodium chloride 0.9 % (NS) infusion
100 | Freq: Every day | INTRAVENOUS | Status: DC
Start: 2019-12-22 — End: 2019-12-22

## 2019-12-22 MED ORDER — magnesium sulfate 1 g in D5W IVPB Premix
1 | Freq: Once | INTRAVENOUS | Status: AC
Start: 2019-12-22 — End: 2019-12-22
  Administered 2019-12-22: 12:00:00 1100 g via INTRAVENOUS
  Administered 2019-12-22: 11:00:00 1 g via INTRAVENOUS

## 2019-12-22 MED ORDER — LORazepam (ATIVAN) injection 2 mg
2 | INTRAMUSCULAR | Status: DC | PRN
Start: 2019-12-22 — End: 2019-12-22

## 2019-12-22 MED ORDER — diazePAM (VALIUM) syringe 2.5-5 mg
5 | INTRAMUSCULAR | Status: DC | PRN
Start: 2019-12-22 — End: 2019-12-22

## 2019-12-22 MED ORDER — naloxone (NARCAN) injection 0.08 mg
0.4 | INTRAMUSCULAR | Status: DC | PRN
Start: 2019-12-22 — End: 2019-12-22

## 2019-12-22 MED ORDER — ondansetron (ZOFRAN) injection 4 mg
4 | Freq: Four times a day (QID) | INTRAMUSCULAR | Status: DC | PRN
Start: 2019-12-22 — End: 2019-12-22

## 2019-12-22 MED ORDER — carboxymethylcellulose sodium (REFRESH TEARS) 0.5 % ophthalmic solution 1-2 drop
0.5 | OPHTHALMIC | Status: DC | PRN
Start: 2019-12-22 — End: 2019-12-22

## 2019-12-22 MED ORDER — GI cocktail
2 | Freq: Once | Status: AC
Start: 2019-12-22 — End: 2019-12-21
  Administered 2019-12-22: 07:00:00 via ORAL

## 2019-12-22 MED ORDER — diazePAM (VALIUM) syringe 5-10 mg
5 | INTRAMUSCULAR | Status: DC | PRN
Start: 2019-12-22 — End: 2019-12-22

## 2019-12-22 MED ORDER — ondansetron (ZOFRAN) injection 8 mg
4 | Freq: Once | INTRAMUSCULAR | Status: AC
Start: 2019-12-22 — End: 2019-12-21
  Administered 2019-12-22: 07:00:00 4 mg via INTRAVENOUS

## 2019-12-22 MED ORDER — ondansetron (ZOFRAN) 4 MG tablet
4 | ORAL_TABLET | Freq: Three times a day (TID) | ORAL | 0 refills | Status: AC | PRN
Start: 2019-12-22 — End: 2019-12-29

## 2019-12-22 MED ORDER — thiamine (Vitamin B-1) 100 mg in sodium chloride 0.9 % (NS) infusion
100 | Freq: Every day | INTRAVENOUS | Status: DC
Start: 2019-12-22 — End: 2019-12-22
  Administered 2019-12-22: 17:00:00 via INTRAVENOUS

## 2019-12-22 MED ORDER — sodium chloride 0.9 % (NS flush) syringe 10 mL
INTRAMUSCULAR | Status: DC
Start: 2019-12-22 — End: 2019-12-22
  Administered 2019-12-22: 17:00:00 via INTRAVENOUS

## 2019-12-22 MED ORDER — acetaminophen (TYLENOL) tablet 650 mg
325 | ORAL | Status: DC | PRN
Start: 2019-12-22 — End: 2019-12-22

## 2019-12-22 MED FILL — CHLORDIAZEPOXIDE 25 MG CAPSULE: 25 mg | ORAL | Qty: 1

## 2019-12-22 MED FILL — MAGNESIUM SULFATE 1 GRAM/100 ML IN DEXTROSE 5 % INTRAVENOUS PIGGYBACK: 1 gram/100 mL | INTRAVENOUS | Qty: 100

## 2019-12-22 MED FILL — MAG-AL PLUS 200 MG-200 MG-20 MG/5 ML ORAL SUSPENSION: 200-200-20 | ORAL | Qty: 30

## 2019-12-22 MED FILL — FAMOTIDINE 20 MG TABLET: 20 mg | ORAL | Qty: 1

## 2019-12-22 MED FILL — FOLIC ACID 1 MG TABLET: 1 mg | ORAL | Qty: 1

## 2019-12-22 MED FILL — ONDANSETRON HCL (PF) 4 MG/2 ML INJECTION SOLUTION: 4 | INTRAMUSCULAR | Qty: 4

## 2019-12-22 MED FILL — DIAZEPAM 5 MG TABLET: 5 mg | ORAL | Qty: 1

## 2019-12-22 MED FILL — SODIUM CHLORIDE 0.9 % INTRAVENOUS SOLUTION: 125.0000 mL/h | INTRAVENOUS | Qty: 1000

## 2019-12-22 MED FILL — LORAZEPAM 2 MG/ML INJECTION SOLUTION: 2 mg/mL | INTRAMUSCULAR | Qty: 1

## 2019-12-22 NOTE — Plan of Care (Signed)
Problem: Potential for Infection  Goal: Remains infection free  Description: Assess and monitor vital signs, skin (color, moisture, integrity, turgor), respiratory status, urinary and gastrointestinal status, and labs (WBC, cultures).  Administer antibiotics and antipyretics as ordered.  Ensure aseptic care of all intravenous lines, invasive tubes/drains and wounds. Monitor for signs and symptoms of infection (redness, warmth, discharge, increased body temperature). Wash hands properly before and after each patient care activity.  Follow isolation guidelines per hospital protocol/policy.  Collaborate with interdisciplinary team and initiate plan and interventions as ordered.  Outcome: Progressing

## 2019-12-22 NOTE — ED Notes (Signed)
Sent doc halo to [facility] for consult

## 2019-12-22 NOTE — H&P (Addendum)
History and Physical      Name: Dennis Palmer  DOB: 16-Oct-1980 39 y.o.  MRN: [id number]  CSN: [id number]    History:     Chief Complaint:  Chest Pain    History of Present Illness:  Dennis Palmer is a 39 y.o. male who presents for evaluation of CP, SOB, N/V, palpitations for 2-3 days.  Very heavy alcohol user - some 30 beers daily plus moonshine.  Last drink 35 min PTA.  Denies prior Hx of pancreatitis.  He also admits recent use of ketamine, LSD, Fentanyl, cocaine, MJ.  PMH non-obtainable.  Hx of heavy smoking, and meth IV use. Lives in a trailer, no heat.  Afebrile, HR 105, BP 156/90, adequate O2 sats.  He is sedated and difficult to arouse, mumbles briefly, and back to sleep. Face flushed and diaphoretic.  Lungs CTA, cor regular, no murmur heard.  Abdomen is soft, appears to be nontender.  CBC, CMP, and troponin WNL.  Lipase 188,  Mage 1.5.  EtOH 220.  UDS MJ..  UA neg.  EKG SR.   CXR clear.  Given GI cocktail, Ativan x 2, Zofran, IV NS 1 liter in ED.       Past Medical History:  History reviewed. No pertinent past medical history.  Past Surgical History:  History reviewed. No pertinent surgical history.  Current Medications:  Prior to Admission Medications    Not on File        Allergies:  Sulfa (sulfonamide antibiotics)    Family History:  History reviewed. No pertinent family history.  Social History:  Social History     Tobacco Use   . Smoking status: Heavy Tobacco Smoker   . Smokeless tobacco: Not on file   Substance Use Topics   . Alcohol use: Yes     Comment: daily   . Drug use: Yes     Types: IV, Cocaine, Methamphetamines, Marijuana     Comment: states he uses all drugs including LSD and ketamine as well     Immunization:    There is no immunization history on file for this patient.  Review of Systems:  Review of Systems   Constitutional: Negative.    HENT: Negative.    Eyes: Negative.    Respiratory: Positive for shortness of breath.    Cardiovascular: Positive for chest pain and palpitations.    Gastrointestinal: Positive for nausea and vomiting.   Genitourinary: Negative.    Musculoskeletal: Negative.    Skin: Negative.    Neurological: Negative.    Endo/Heme/Allergies: Negative.    Psychiatric/Behavioral: Negative.        Physical Exam:     Vital Signs:  Initial Vitals [12/21/19 2255]   BP (!) 150/91   Pulse 94   Resp 18   Temp 37 ?C (98.6 ?F)   SpO2 96 %     Exam:  Physical Exam  Vitals and nursing note reviewed.   Constitutional:       Appearance: He is well-developed and normal weight.      Comments: sedated   HENT:      Head: Normocephalic and atraumatic.   Eyes:      Extraocular Movements: Extraocular movements intact.      Pupils: Pupils are equal, round, and reactive to light.   Cardiovascular:      Rate and Rhythm: Regular rhythm. Tachycardia present.      Heart sounds: Normal heart sounds.   Pulmonary:      Effort: Pulmonary effort is  normal.      Breath sounds: Normal breath sounds.   Abdominal:      General: Bowel sounds are normal.      Palpations: Abdomen is soft.   Musculoskeletal:         General: Normal range of motion.      Cervical back: Normal range of motion and neck supple.   Skin:     General: Skin is warm.      Comments: Diaphoretic.   Neurological:      Comments: Sedated, mumbles responses, but mostly sleeps.  No tremor.         Data:     Labs:  Results for orders placed or performed during the hospital encounter of 12/21/19 (from the past 24 hour(s))   CBC with Auto Differential -STAT    Collection Time: 12/21/19 11:11 PM   Result Value Ref Range    WBC 8.6 4.8 - 10.8 10*3/?L    RBC 4.57 (L) 4.70 - 6.10 10*6/?L    Hemoglobin 15.1 12.0 - 18.0 g/dL    HCT 82.9 (L) 56.2 - 54.0 %    MCV 91.1 81.0 - 99.0 fL    MCH 33.0 27.0 - 34.0 pg    MCHC 36.2 (H) 32.0 - 36.0 g/dL    RDW 13.0 (H) 86.5 - 14.5 %    Platelet Count 312 150 - 400 10*3/?L    MPV 6.3 (L) 7.4 - 10.4 fL    Neutrophils % 61 35 - 70 %    Lymphocytes % 29 25 - 45 %    Monocytes % 8 0 - 12 %    Eosinophils % 1 0 - 7 %     Basophils % 1 0 - 2 %    Neutrophils, Absolute 5.2 1.6 - 7.3 10*3/?L    Lymphocytes, Absolute 2.5 1.1 - 4.3 10*3/?L    Monocytes, Absolute 0.7 0.0 - 1.2 10*3/?L    Eosinophils, Absolute 0.1 0.0 - 0.7 10*3/?L    Basophils, Absolute 0.1 0.0 - 0.2 10*3/?L    Differential Type Automated Differential    Comprehensive Metabolic Panel -STAT    Collection Time: 12/21/19 11:11 PM   Result Value Ref Range    Sodium 141 135 - 143 mmol/L    Potassium 3.7 3.5 - 5.1 mmol/L    Chloride 107 98 - 111 mmol/L    CO2 - Carbon Dioxide 25 21 - 31 mmol/L    Glucose 97 80 - 99 mg/dL    BUN 10 6 - 23 mg/dL    Creatinine 7.84 6.96 - 1.30 mg/dL    Calcium 8.7 8.6 - 29.5 mg/dL    AST - Aspartate Aminotransferase 20 10 - 50 IU/L    ALT - Alanine Aminotransferase 13 7 - 52 IU/L    Alkaline Phosphatase 119 (H) 34 - 104 IU/L    Bilirubin Total 0.3 0.3 - 1.2 mg/dL    Protein Total 6.6 6.0 - 8.0 g/dL    Albumin 3.6 3.5 - 5.0 g/dL    Globulin 3.0 2.2 - 3.7 g/dL    Albumin/Globulin Ratio 1.2 >0.9    Anion Gap 9 4 - 13 mmol/L    GFR Estimate Male >60 >=60 mL/min/1.42m*2    GFR Additional Info     Lipase -STAT    Collection Time: 12/21/19 11:11 PM   Result Value Ref Range    Lipase 188 (H) 10 - 60 IU/L   Troponin-I -STAT    Collection Time: 12/21/19 11:11 PM  Result Value Ref Range    Troponin I <0.03 <=0.04 ng/mL   Magnesium -STAT    Collection Time: 12/21/19 11:11 PM   Result Value Ref Range    Magnesium 1.5 (L) 1.6 - 2.4 mg/dL   Phosphorus -STAT    Collection Time: 12/21/19 11:11 PM   Result Value Ref Range    Phosphorus 3.7 2.5 - 4.5 mg/dL   Alcohol, Serum -STAT    Collection Time: 12/21/19 11:11 PM   Result Value Ref Range    Alcohol, Serum 220 (H) <10 mg/dL   Drug Screen Panel 8 -STAT Urine Urine, Void:  non-clean catch    Collection Time: 12/21/19 11:35 PM   Result Value Ref Range    Creatinine, Urine 32 >20 mg/dL    Amphetamine Negative Negative    Barbiturates Negative Negative    Benzodiazepines Negative Negative    Cocaine Negative Negative     Marijuana Positive (A) Negative    Opiates Negative Negative    Oxycodone Negative Negative    Methadone Negative Negative   Urinalysis with Microscopic -Once Urine Urine, Void: Clean Catch (mid stream)    Collection Time: 12/21/19 11:35 PM   Result Value Ref Range    Color, Urine Straw Yellow    Clarity, Urine Clear Clear    Glucose, Urine Negative Negative mg/dL    Bilirubin, Urine Negative Negative    Ketones, Urine Negative Negative mg/dL    Specific Gravity, Urine 1.005 1.003 - 1.030    Blood, Urine Negative Negative    pH, UA 7.0 5.0 - 9.0    Protein, Urine Negative Negative mg/dL    Urobilinogen, Urine Normal Normal mg/dL    Nitrite, Urine Negative Negative    Leukocyte Esterase, Urine Negative Negative    Ascorbic Acid, Urine Negative Negative    White Blood Cell Microscopic Numeric 1 <=9 /HPF    RBC, Urine 0 <=5 /HPF    Bacteria, Urine None Seen None Seen /HPF    Squamous Epithelial, Urine 1 <=5 /HPF          Imaging for last 24 hours:  No results found.    Special Studies:  none    Assessment:     Diagnoses:  Active Hospital Problems    Diagnosis SNOMED CT(R) Date Noted   . Acute alcoholic pancreatitis ALCOHOL-INDUCED ACUTE PANCREATITIS 12/22/2019     Priority: High     Class: Acute       Plan:   Admit, IVF, clear liquid diet, tx pain and nausea prn.  Replace mag. NIcotine replacement.  CIWA protocol.  Significant withdrawal seems likely.  Inpatient status.    Saxon Barich  12/22/2019  1:20 AM

## 2019-12-22 NOTE — Discharge Instructions (Signed)
Patient Education     Alcohol Withdrawal Syndrome  When a person who drinks a lot of alcohol stops drinking, he or she may have unpleasant and serious symptoms. These symptoms are called alcohol withdrawal syndrome. This condition may be mild or severe. It can be life-threatening. It can cause:  · Shaking that you cannot control (tremor).  · Sweating.  · Headache.  · Feeling fearful, upset, grouchy, or depressed.  · Trouble sleeping (insomnia).  · Nightmares.  · Fast or uneven heartbeats (palpitations).  · Alcohol cravings.  · Feeling sick to your stomach (nausea).  · Throwing up (vomiting).  · Being bothered by light and sounds.  · Confusion.  · Trouble thinking clearly.  · Not being hungry (loss of appetite).  · Big changes in mood (mood swings).  If you have all of the following symptoms at the same time, get help right away:  · High blood pressure.  · Fast heartbeat.  · Trouble breathing.  · Seizures.  · Seeing, hearing, feeling, smelling, or tasting things that are not there (hallucinations).  These symptoms are known as delirium tremens (DTs). They must be treated at the hospital right away.  Follow these instructions at home:    · Take over-the-counter and prescription medicines only as told by your doctor. This includes vitamins.  · Do not drink alcohol.  · Do not drive until your doctor says that this is safe for you.  · Have someone stay with you or be available in case you need help. This should be someone you trust. This person can help you with your symptoms. He or she can also help you to not drink.  · Drink enough fluid to keep your pee (urine) pale yellow.  · Think about joining a support group or a treatment program to help you stop drinking.  · Keep all follow-up visits as told by your doctor. This is important.  Contact a doctor if:  · Your symptoms get worse.  · You cannot eat or drink without throwing up.  · You have a hard time not drinking alcohol.  · You cannot stop drinking alcohol.  Get  help right away if:  · You have fast or uneven heartbeats.  · You have chest pain.  · You have trouble breathing.  · You have a seizure for the first time.  · You see, hear, feel, smell, or taste something that is not there.  · You get very confused.  Summary  · When a person who drinks a lot of alcohol stops drinking, he or she may have serious symptoms. This is called alcohol withdrawal syndrome.  · Delirium tremens (DTs) is a group of life-threatening symptoms. You should get help right away if you have these symptoms.  · Think about joining an alcohol support group or a treatment program.  This information is not intended to replace advice given to you by your health care provider. Make sure you discuss any questions you have with your health care provider.  Document Revised: 01/03/2017 Document Reviewed: 09/27/2016  Elsevier Patient Education © 2021 [employer]

## 2019-12-22 NOTE — ED Notes (Signed)
Ginger ale given at pt's request

## 2019-12-22 NOTE — Discharge Summary (Signed)
Physician Discharge Summary     Patient ID:  Name: Dennis Palmer  DOB: [birth year] 39 y.o.  MRN: [id number]  CSN: [id number]    Admit date: 12/21/2019    Discharge date: 12/22/2019    Admission Diagnosis: Alcohol withdrawal with delirium [facility]) [F10.231]    Discharge Diagnoses:   Active Hospital Problems    Diagnosis SNOMED CT(R) Date Noted   . Acute alcoholic pancreatitis ALCOHOL-INDUCED ACUTE PANCREATITIS 12/22/2019     Class: Acute   . Alcohol withdrawal with delirium (HCC) ALCOHOL WITHDRAWAL DELIRIUM 12/22/2019       Significant Diagnostic Studies:   Results for orders placed or performed during the hospital encounter of 12/21/19 (from the past 24 hour(s))   CBC with Auto Differential -STAT    Collection Time: 12/21/19 11:11 PM   Result Value Ref Range    WBC 8.6 4.8 - 10.8 10*3/?L    RBC 4.57 (L) 4.70 - 6.10 10*6/?L    Hemoglobin 15.1 12.0 - 18.0 g/dL    HCT 78.4 (L) 69.6 - 54.0 %    MCV 91.1 81.0 - 99.0 fL    MCH 33.0 27.0 - 34.0 pg    MCHC 36.2 (H) 32.0 - 36.0 g/dL    RDW 29.5 (H) 28.4 - 14.5 %    Platelet Count 312 150 - 400 10*3/?L    MPV 6.3 (L) 7.4 - 10.4 fL    Neutrophils % 61 35 - 70 %    Lymphocytes % 29 25 - 45 %    Monocytes % 8 0 - 12 %    Eosinophils % 1 0 - 7 %    Basophils % 1 0 - 2 %    Neutrophils, Absolute 5.2 1.6 - 7.3 10*3/?L    Lymphocytes, Absolute 2.5 1.1 - 4.3 10*3/?L    Monocytes, Absolute 0.7 0.0 - 1.2 10*3/?L    Eosinophils, Absolute 0.1 0.0 - 0.7 10*3/?L    Basophils, Absolute 0.1 0.0 - 0.2 10*3/?L    Differential Type Automated Differential    Comprehensive Metabolic Panel -STAT    Collection Time: 12/21/19 11:11 PM   Result Value Ref Range    Sodium 141 135 - 143 mmol/L    Potassium 3.7 3.5 - 5.1 mmol/L    Chloride 107 98 - 111 mmol/L    CO2 - Carbon Dioxide 25 21 - 31 mmol/L    Glucose 97 80 - 99 mg/dL    BUN 10 6 - 23 mg/dL    Creatinine 1.32 4.40 - 1.30 mg/dL    Calcium 8.7 8.6 - 10.2 mg/dL    AST - Aspartate Aminotransferase 20 10 - 50 IU/L    ALT - Alanine Aminotransferase 13 7  - 52 IU/L    Alkaline Phosphatase 119 (H) 34 - 104 IU/L    Bilirubin Total 0.3 0.3 - 1.2 mg/dL    Protein Total 6.6 6.0 - 8.0 g/dL    Albumin 3.6 3.5 - 5.0 g/dL    Globulin 3.0 2.2 - 3.7 g/dL    Albumin/Globulin Ratio 1.2 >0.9    Anion Gap 9 4 - 13 mmol/L    GFR Estimate Male >60 >=60 mL/min/1.53m*2    GFR Additional Info     Lipase -STAT    Collection Time: 12/21/19 11:11 PM   Result Value Ref Range    Lipase 188 (H) 10 - 60 IU/L   Troponin-I -STAT    Collection Time: 12/21/19 11:11 PM   Result Value Ref Range    Troponin  I <0.03 <=0.04 ng/mL   Magnesium -STAT    Collection Time: 12/21/19 11:11 PM   Result Value Ref Range    Magnesium 1.5 (L) 1.6 - 2.4 mg/dL   Phosphorus -STAT    Collection Time: 12/21/19 11:11 PM   Result Value Ref Range    Phosphorus 3.7 2.5 - 4.5 mg/dL   Alcohol, Serum -STAT    Collection Time: 12/21/19 11:11 PM   Result Value Ref Range    Alcohol, Serum 220 (H) <10 mg/dL   Drug Screen Panel 8 -STAT Urine Urine, Void:  non-clean catch    Collection Time: 12/21/19 11:35 PM   Result Value Ref Range    Creatinine, Urine 32 >20 mg/dL    Amphetamine Negative Negative    Barbiturates Negative Negative    Benzodiazepines Negative Negative    Cocaine Negative Negative    Marijuana Positive (A) Negative    Opiates Negative Negative    Oxycodone Negative Negative    Methadone Negative Negative   Urinalysis with Microscopic -Once Urine Urine, Void: Clean Catch (mid stream)    Collection Time: 12/21/19 11:35 PM   Result Value Ref Range    Color, Urine Straw Yellow    Clarity, Urine Clear Clear    Glucose, Urine Negative Negative mg/dL    Bilirubin, Urine Negative Negative    Ketones, Urine Negative Negative mg/dL    Specific Gravity, Urine 1.005 1.003 - 1.030    Blood, Urine Negative Negative    pH, UA 7.0 5.0 - 9.0    Protein, Urine Negative Negative mg/dL    Urobilinogen, Urine Normal Normal mg/dL    Nitrite, Urine Negative Negative    Leukocyte Esterase, Urine Negative Negative    Ascorbic Acid, Urine  Negative Negative    White Blood Cell Microscopic Numeric 1 <=9 /HPF    RBC, Urine 0 <=5 /HPF    Bacteria, Urine None Seen None Seen /HPF    Squamous Epithelial, Urine 1 <=5 /HPF   SARS-CoV-2 (COVID-19), Screening/Asymptomatic Unexposed -STAT Nasopharynx Nasopharyngeal    Collection Time: 12/22/19 12:35 AM    Specimen: Nasopharynx; Nasopharyngeal   Result Value Ref Range    SARS-CoV-2 (COVID-19) by RT-PCR Not Detected Not Detected    COVID-19 Resulting Lab [facility], [address] [place], [place] [address]      X-ray chest PA and lateral    Result Date: 12/22/2019  Impression IMPRESSION:  Normal chest.     Procedures/Treatments:     Consults:   IP CONSULT TO SOCIAL WORK  IP CONSULT TO SOCIAL WORK    Physical Exam:   Physical Exam  Vitals and nursing note reviewed.   HENT:      Head: Atraumatic.   Cardiovascular:      Rate and Rhythm: Normal rate and regular rhythm.   Pulmonary:      Effort: Pulmonary effort is normal.      Breath sounds: Normal breath sounds.   Abdominal:      Palpations: Abdomen is soft.   Musculoskeletal:         General: Normal range of motion.   Skin:     General: Skin is warm and dry.   Neurological:      Mental Status: He is alert and oriented to person, place, and time.         Hospital Course: Dennis Palmer is a 39 year old male who presented to the emergency department due to chest pain, shortness of breath, nausea vomiting and palpitations for 2 to  3 days.  He is from [place].  Is a very heavy alcohol user drinking 30 beers a day plus moonshine and also admits to recent use of ketamine, LSD, fentanyl, cocaine and marijuana.  Additionally he uses methamphetamine and smokes.  Chest x-ray is clear, labs are generally normal except for an EtOH of 220, lipase 188.      He was given supportive care overnight as well as Ativan, GI cocktail, Zofran and IV fluids and this morning is alert and oriented, requesting food and is stable for discharge.  He does have a very significant tremor  already.      Discharge Medications:     Patient's Discharge Medication List      TAKE these medications      Indications of Use   famotidine 20 MG tablet  Quantity: 60 tablet  Commonly known as: PEPCID  Take 1 tablet by mouth 2 times daily.      ondansetron 4 MG tablet  Quantity: 21 tablet  Commonly known as: ZOFRAN  Take 1 tablet by mouth every 8 hours as needed for Nausea for up to 7 days.      thiamine 100 MG tablet  Quantity: 30 tablet  Commonly known as: Vitamin B-1  Take 1 tablet by mouth daily.                 Patient Instructions/Follow-up Appointments:   Pending Labs     Order Current Status    Extra Tubes (ASA) -Next Routine In process    Red Top -Once In process        Activity: activity as tolerated  Diet: regular diet and diet as tolerated  Discharge Disposition: To home on independent care  Follow-up with PCP in 1 week.     LOS: 0 days   Time spent on discharge: 15 mins    Signed:  Bethena Midget, NP  12/22/2019  9:26 AM

## 2019-12-22 NOTE — ED Notes (Signed)
Pt had pulled off all lines and when RN attempted to flush IV it appeared to have become occluded

## 2020-01-03 IMAGING — CR DG CHEST 2V
2 series · 2 of 2 positions shown · non-contrast
Comparison: None.

CLINICAL DATA: Myalgias, cough, alcohol withdrawal

EXAM:
CHEST - 2 VIEW

[w chest pa]
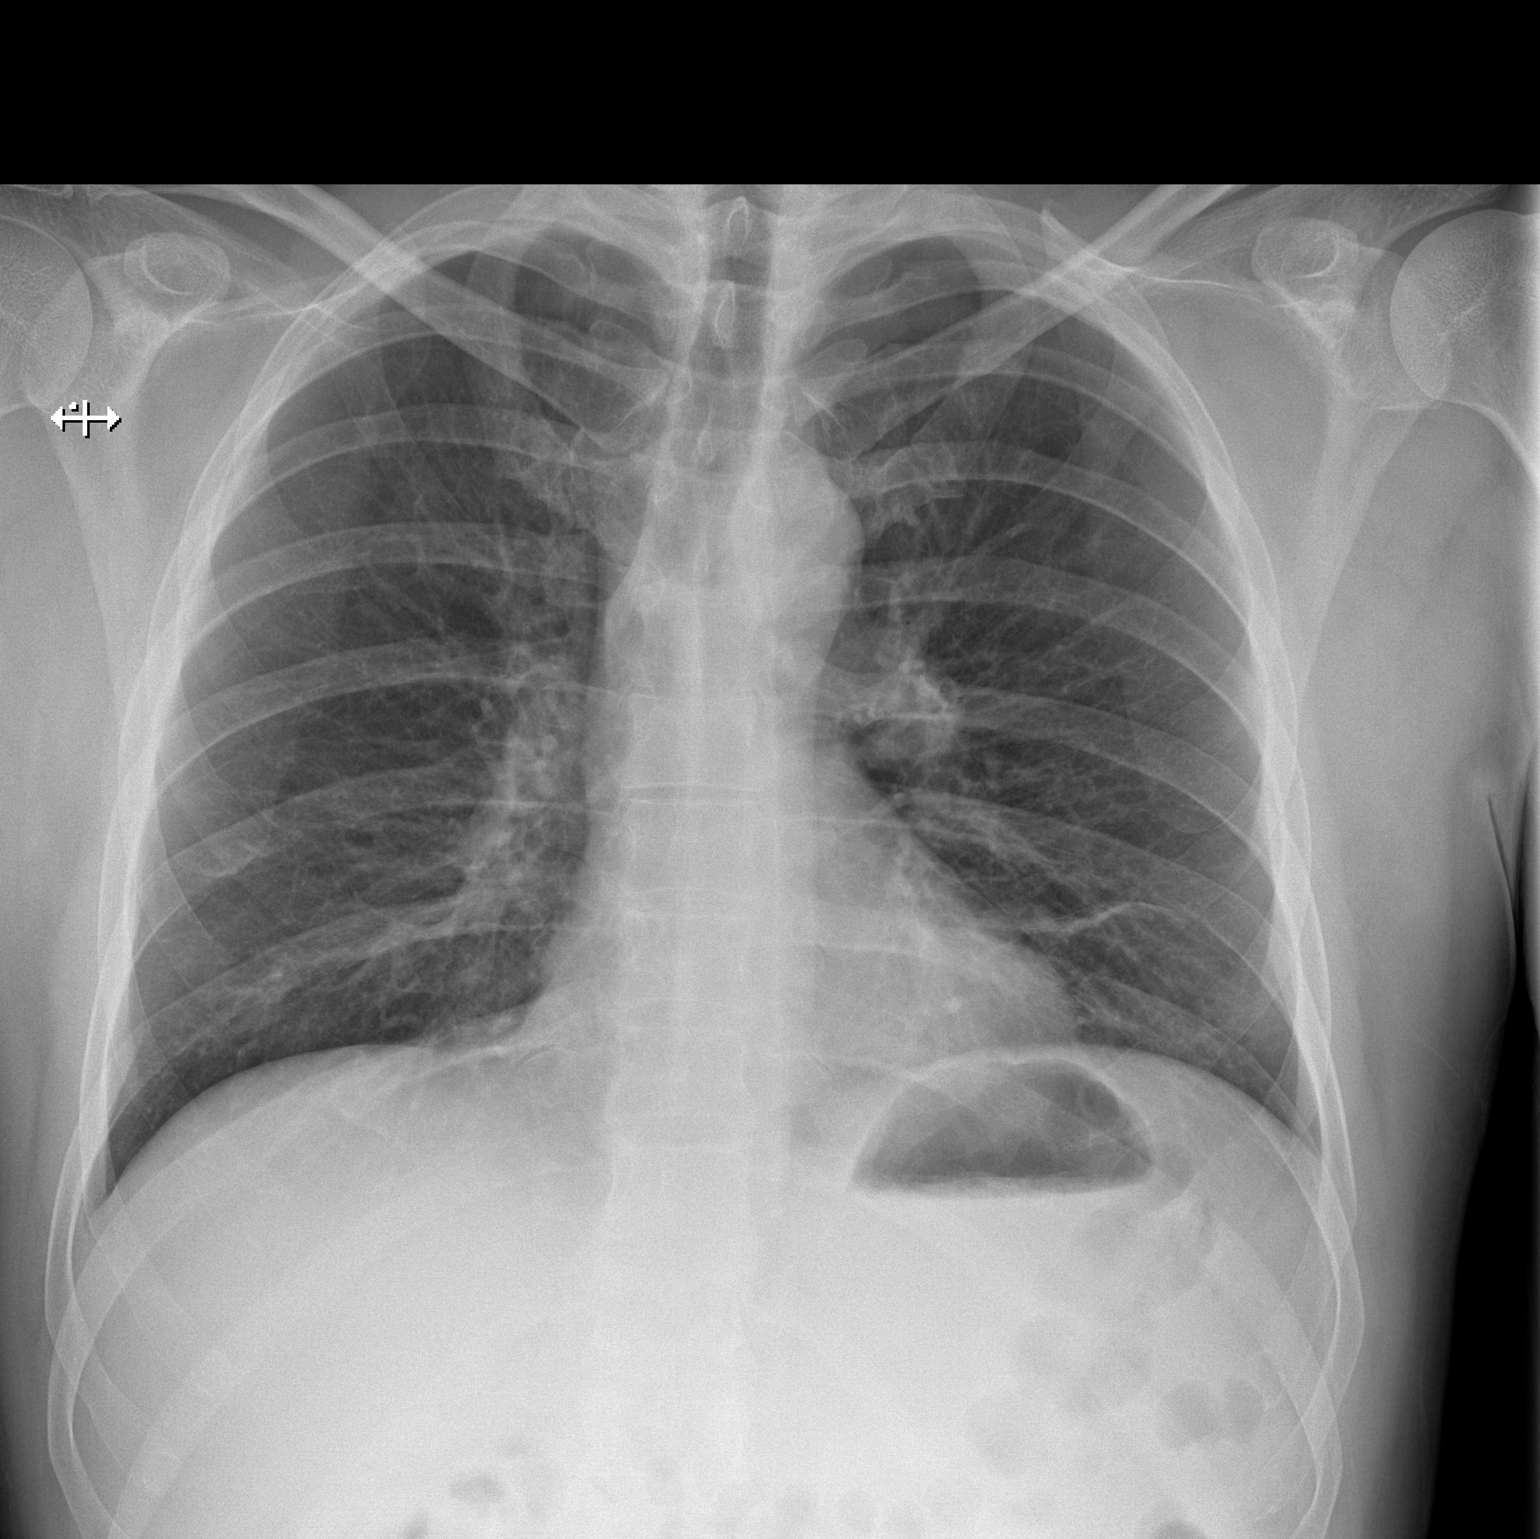

[w chest lat]
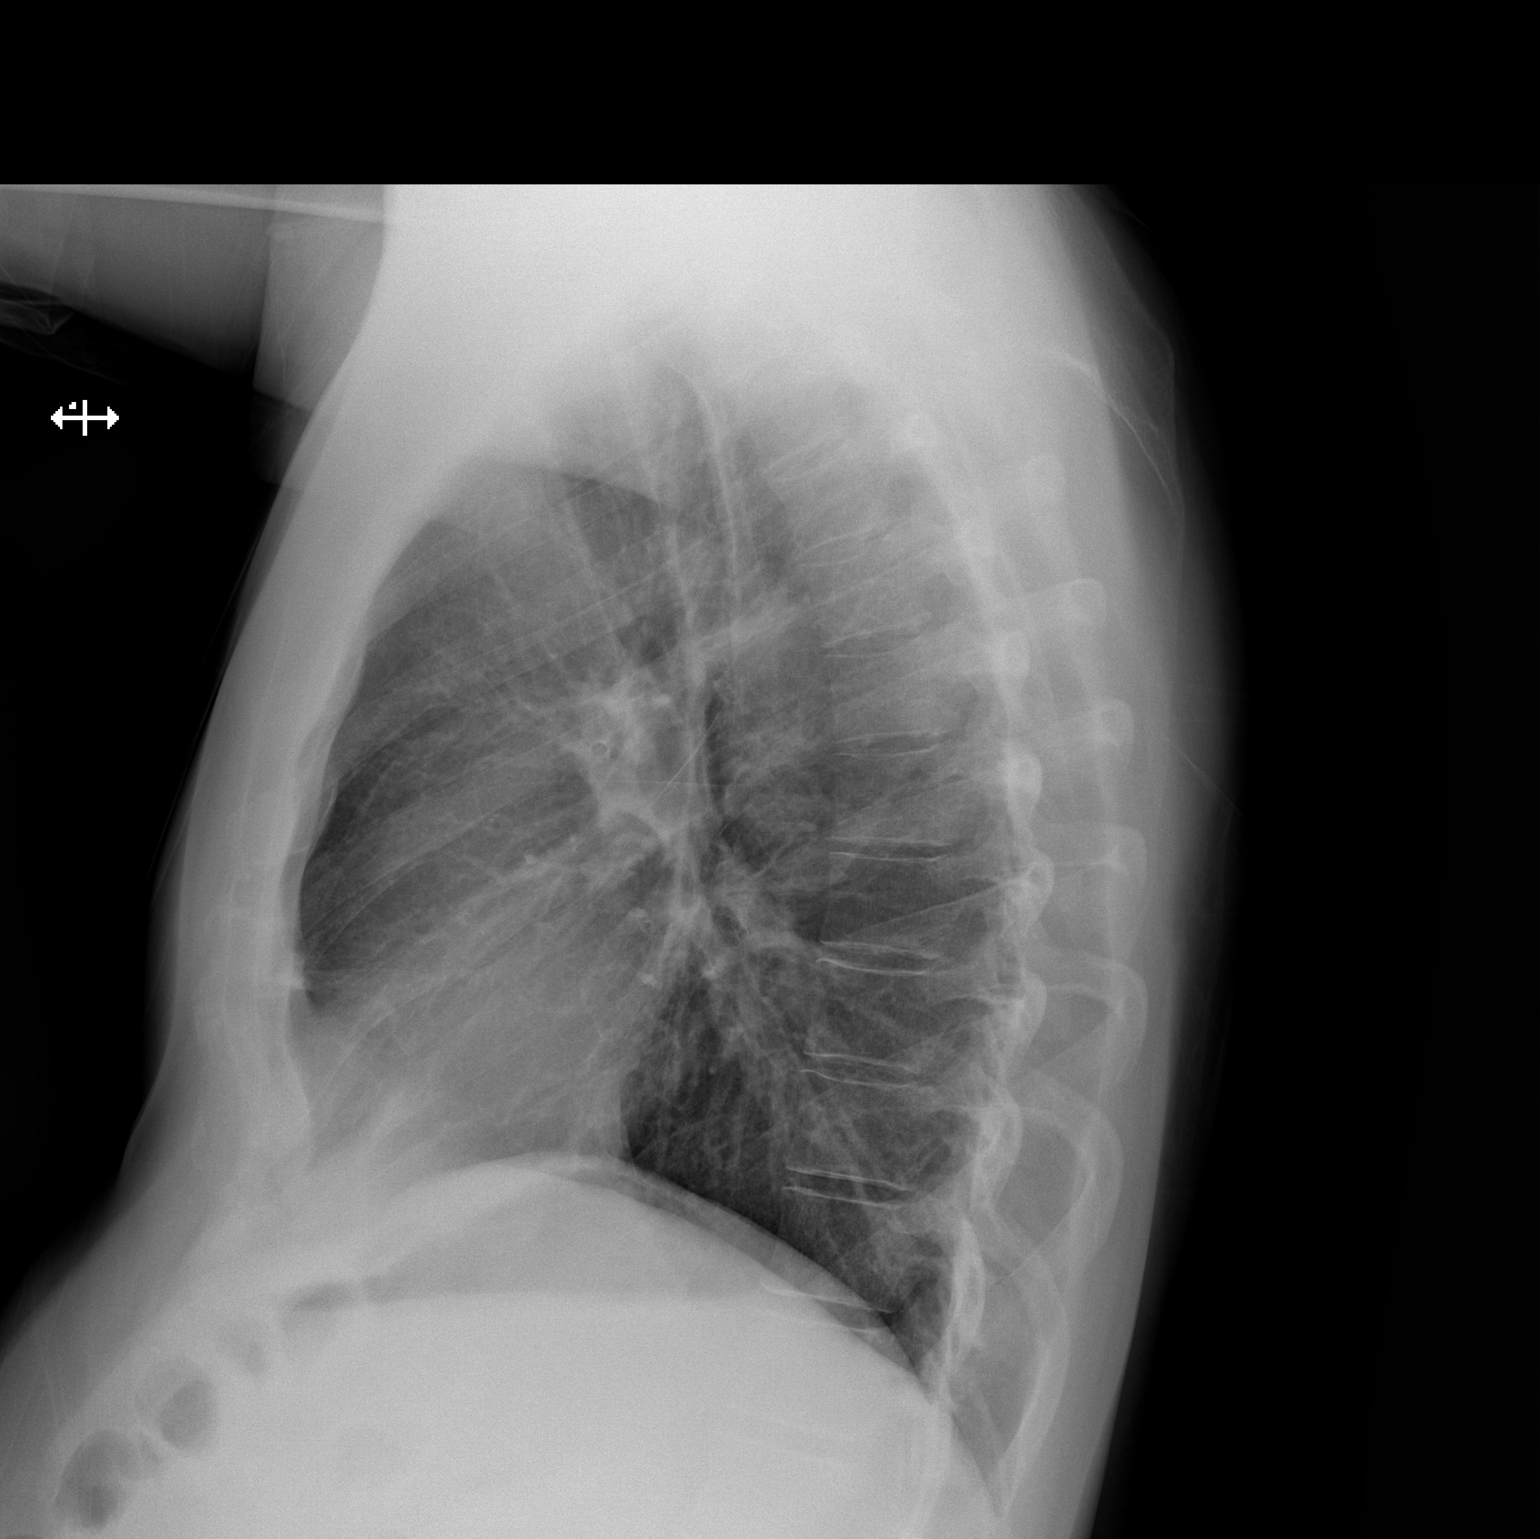

[2 of 2 positions shown; findings below may reference images not displayed]

FINDINGS: Normal heart size and vascularity. Minor streaky bibasilar densities
compatible with atelectasis. No focal pneumonia, collapse or
consolidation. Negative for edema, effusion or pneumothorax. Trachea
is midline. No acute osseous finding.
IMPRESSION: Bibasilar atelectasis.  No other acute process by plain radiography

## 2022-03-12 NOTE — Progress Notes
Patient: Harry Gomez  MRN: [id number]  DOB: 1980-06-12    Referring Practitioner: Referral, Self  Primary Care Provider:  No primary care provider on file.    Chief Complaint: hormone check  History of Present Illness:     Harry Gomez is a 42 y.o. male who presents for evaluation of hormone check       (click to expand/collapse)      Past Medical History  There is no problem list on file for this patient.      Past Surgical History  No past surgical history on file.     Current Home Medications  No outpatient medications prior to visit.     No facility-administered medications prior to visit.       Allergies  Not on File     Family History  No family history on file.    Social History  Social History     Socioeconomic History    Marital status: Unknown         Review of Systems:   ROS: 14 point review of systems completed. Pertinent positives noted in the HPI.          Objective (click to expand/collapse)      Objective:   There were no vitals filed for this visit.  There is no height or weight on file to calculate BMI.                     Physical Exam:  General: well developed, well nourished, in no distress  HEENT: normocephalic, atraumatic  Respiratory: Unlabored breathing  Cardiovascular: nl s1, s2, RRR, No m/r/g.      Abdomen: soft  MSK: Normal bulk. No clubbing or cyanosis.   Ext: no lower extremity edema   Neuro: AOX4  Psych: Normal mood and affect; appropriate judgment.         Lab Tests/ Studies:    No results found for: ''HGBA1C''     No results found for: ''HGB'', ''VITAMINB12'', ''TSH'', ''T4AUTO'', ''TESTOS'', ''VITD25OH'', ''ALBCREATUR''        Imaging/Studies:               Assessment/Plan:     Harry Gomez is a 42 y.o. old male who presents for evaluation of ***       There are no Patient Instructions on file for this visit.       Author: Heath Lark. Bryah Ocheltree 03/12/2022 9:08 AM    Patient to return to clinic in ***       ***minutes were spent personally by me today on this encounter which include today's pre-visit review of the chart, obtaining appropriate history, performing an evaluation, documentation and discussion of management with details supported within the note for today's visit. The time documented was exclusive of any time spent on the separately billed procedure.

## 2022-03-13 ENCOUNTER — Ambulatory Visit: Payer: PRIVATE HEALTH INSURANCE | Attending: Student in an Organized Health Care Education/Training Program

## 2022-03-13 DIAGNOSIS — E559 Vitamin D deficiency, unspecified: Secondary | ICD-10-CM

## 2022-03-13 DIAGNOSIS — R5383 Other fatigue: Secondary | ICD-10-CM

## 2022-03-13 DIAGNOSIS — R635 Abnormal weight gain: Secondary | ICD-10-CM

## 2022-03-13 DIAGNOSIS — Z131 Encounter for screening for diabetes mellitus: Secondary | ICD-10-CM

## 2022-03-13 NOTE — Progress Notes
Patient: Harry Gomez  MRN: [id number]  DOB: Dec 12, 1980    Referring Practitioner: Nena Jordan., MD  Primary Care Provider:  No primary care provider on file.    Chief Complaint: hormones  History of Present Illness:     Harry Gomez is a 42 y.o. male PMH of Hep C s/p treatment who presents for evaluation of hormones    Reports fatigue and difficulty losing weight over the past months-years. Is doing keto diet and exercise. Heard a podcast that suggested that patient gets his hormone checked. Is interested in getting testosterone checked .    Feels lower libido. Reports some difficulty with erectile function. Denies changes to facial or body hair. Denies galactorrhea. Reports decreased muscle bulk and strength. Denies recent history TBI. Denies testicular trauma/infection. May be interested in having children in the future.    Does not take supplements or medications daily. Has never taken anabolic steroids or testosterone replacement.    Denies constipation/diarrhea, heat/cold intolerance. Denies changes to hair, nails, or skin. Denies palpitations or tremor.    Denies worsening facial acne, striae, easy bruising, LE proximal muscle weakness. Denies dizziness, n/v.    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     Takes Vitamin D3 but is unsure of dose; was told that he had low vitamin D levels previously      (click to expand/collapse)      Past Medical History  There is no problem list on file for this patient.      Past Surgical History  No past surgical history on file.     Current Home Medications  No outpatient medications prior to visit.     No facility-administered medications prior to visit.       Allergies  Allergies   Allergen Reactions    Sulfa Antibiotics         Family History  No family history on file.    Social History  Social History     Socioeconomic History    Marital status: Unknown         Review of Systems:   ROS: 14 point review of systems completed. Pertinent positives noted in the HPI.          Objective (click to expand/collapse)      Objective:     Vitals:    03/13/22 1028   BP: 132/78   Pulse: 85   Temp: 36.2 ?C (97.2 ?F)   TempSrc: Forehead   SpO2: 97%   Weight: 211 lb (95.7 kg)     There is no height or weight on file to calculate BMI.                     Physical Exam:  General: well developed, well nourished, in no distress  HEENT: normocephalic, atraumatic  Respiratory: Unlabored breathing  Cardiovascular: nl s1, s2, RRR, No m/r/g.      Abdomen: soft  MSK: Normal bulk. No clubbing or cyanosis.   Ext: no lower extremity edema   Neuro: AOX4  Psych: Normal mood and affect; appropriate judgment.         Lab Tests/ Studies:    No results found for: ''HGBA1C''     No results found for: ''HGB'', ''VITAMINB12'', ''TSH'', ''T4AUTO'', ''TESTOS'', ''VITD25OH'', ''ALBCREATUR''        Imaging/Studies:   N/a            Assessment/Plan:     Shea Evans  Low is a 42 y.o. old male who presents for evaluation of hormones     #Fatigue: Possible endocrine etiologies include hypothyroidism, hypogonadism, vitamin D deficiency. Patient w/normal BP and has no s/s or risk factors to suggest adrenal insufficiency. 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 Discussed that we need two low 8AM testosterone results to confirm hypogonadism. If he does have low testosterone levels, they may improve with monitoring as his HPG axis recovers and may evaluate for OSA as contributing factor; in addition patient is interested in future fertility so if replacement is needed, could consider clomid as opposed to TRT  -8AM testosterone, FSH, LH, TSH, FT4, vitamin D ordered  -If 8AM testosterone is low, will repeat to confirm as two low 8AM levels are required for diagnosis of hypogonadism    #Vitamin D Deficiency: Recently told his Vit D was low and started on Vit D3, but is unsure of dose  -Continue Vit D3 supplementation.  -Repeat Vit D ordered, goal >30    #Diabetes Screening: Patient is >35 so requires DM screening  -A1c ordered      Patient Instructions   I have ordered labs for you, I will message you regarding the results once they all return       [facility]  [place]  [address], [address]  [place], [place] [address]  [phone] Phone  [phone] Fax  Open Monday to Friday, 7:30 am to 5:00 pm           Author: Rodena Medin K. Shamal Stracener 03/13/2022 11:10 AM    Patient to return to clinic based on lab results       45 minutes were spent personally by me today on this encounter which include today's pre-visit review of the chart, obtaining appropriate history, performing an evaluation, documentation and discussion of management with details supported within the note for today's visit. The time documented was exclusive of any time spent on the separately billed procedure.

## 2022-03-13 NOTE — Patient Instructions
I have ordered labs for you, I will message you regarding the results once they all return       [facility]    [place]  [address]  [place], [place] [address]  [phone] Phone  [phone] Fax  Open Monday to Friday, 7:30 am to 5:00 pm

## 2022-03-18 ENCOUNTER — Ambulatory Visit: Payer: PRIVATE HEALTH INSURANCE

## 2022-03-18 DIAGNOSIS — R5383 Other fatigue: Secondary | ICD-10-CM

## 2022-03-21 ENCOUNTER — Telehealth: Payer: PRIVATE HEALTH INSURANCE

## 2022-03-21 NOTE — Telephone Encounter
PDL Call to Clinic    Reason for Call:Patient states he is at lab to do his 2nd round but the orders are stating he cannot do them until 04/2022.  He is under the impression he was supposed to do them a week after the first round.  Please assist..  Thank you    Appointment Related?  []$  Yes  [x]$  No     If yes;  Date:  Time:    Call warm transferred to PDL: [x]$  Yes  []$  No    Call Received by Clinic Representative: Seth Bake    If call not answered/not accepted, call received by Patient Services Representative:

## 2022-03-21 NOTE — Addendum Note
Addended by: Jason Fila, Hiroyuki Ozanich on: 03/21/2022 09:53 AM     Modules accepted: Orders

## 2022-03-21 NOTE — Addendum Note
Addended by: Jason Fila, Latonya Nelon on: 03/21/2022 09:53 AM     Modules accepted: Orders

## 2022-05-29 ENCOUNTER — Ambulatory Visit: Payer: PRIVATE HEALTH INSURANCE

## 2022-05-31 ENCOUNTER — Telehealth: Payer: PRIVATE HEALTH INSURANCE | Attending: Student in an Organized Health Care Education/Training Program

## 2022-05-31 DIAGNOSIS — E559 Vitamin D deficiency, unspecified: Secondary | ICD-10-CM

## 2022-05-31 DIAGNOSIS — E230 Hypopituitarism: Secondary | ICD-10-CM

## 2022-05-31 DIAGNOSIS — R5383 Other fatigue: Secondary | ICD-10-CM

## 2022-05-31 NOTE — Progress Notes
Patient: Harry Gomez  MRN: [id number]  DOB: 1980-09-02    Referring Practitioner: Nena Jordan., MD  Primary Care Provider:  No primary care provider on file.    Chief Complaint: hormones  History of Present Illness:     [name] is a 42 y.o. male PMH of Hep C s/p treatment who presents for evaluation of hormones      Patient Consent to Telehealth   The patient agreed to participate in the video visit prior to joining the visit.        Reports fatigue and difficulty losing weight over the past months-years. Is doing keto diet and exercise. Heard a podcast that suggested that patient gets his hormone checked. Is interested in getting testosterone checked .    Feels lower libido. Reports some difficulty with erectile function. Denies changes to facial or body hair. Denies galactorrhea. Reports decreased muscle bulk and strength. Denies recent history TBI. Denies testicular trauma/infection. May be interested in having children in the future.    Does not take supplements or medications daily. Has never taken anabolic steroids or testosterone replacement.    Denies constipation/diarrhea, heat/cold intolerance. Denies changes to hair, nails, or skin. Denies palpitations or tremor.    Denies worsening facial acne, striae, easy bruising, LE proximal muscle weakness. Denies dizziness, n/v.    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     Takes Vitamin D3 but is unsure of dose; was told that he had low vitamin D levels previously    Interval Events  -LCV 03/13/22  -Labs show low testosterone below. Free testosterone borderline low. LH and FSH inappropriately normal. PRL and Ferritin wnl, TFTs wnl,  Vit D wnl.  -Has remained sober for the past 6 months  -Patient is interested in starting anabolic steroids and clomid from his friend and had questions regarding this  -Reports ongoing fatigue, difficulty with weight loss  -Continues vitamin D supplementation    Component Latest Ref Rng 03/15/2022  9:27 AM 03/21/2022  9:57 AM   Testosterone, Adult Male      300 - 890 ng/dL 478 (L)  295 (L)    Sex Hormone Binding Globulin      17 - 56 nmol/L 25  24    Testosterone, Bioavailable      131 - 682 ng/dL 621  308    Testosterone, Free Calculation      47 - 244 pg/mL 51  47    Testosterone, Percentage Free      1.6 - 2.9 % 2.0  2.0    FSH      See Comment mIU/mL 3.9  4.1    LH      mIU/mL 7.2  7.4    Vitamin D,25-Hydroxy      20 - 50 ng/mL 32     TSH      0.3 - 4.7 mcIU/mL 2.2     Free T4      0.80 - 1.70 ng/dL 6.57     Prolactin,serum      3.8 - 18.9 ng/mL  6.3    Ferritin      8 - 350 ng/mL  88        (click to expand/collapse)      Past Medical History  There is no problem list on file for this patient.      Past Surgical History  No past surgical history on file.     Current Home  Medications  No outpatient medications prior to visit.     No facility-administered medications prior to visit.       Allergies  Allergies   Allergen Reactions    Sulfa Antibiotics         Family History  No family history on file.    Social History  Social History     Socioeconomic History    Marital status: Unknown         Review of Systems:   ROS: 14 point review of systems completed. Pertinent positives noted in the HPI.          Objective (click to expand/collapse)      Objective:     There were no vitals filed for this visit.    There is no height or weight on file to calculate BMI.                       Physical Exam:  General: well developed, well nourished, in no distress  HEENT: normocephalic, atraumatic  Respiratory: Unlabored breathing  MSK: Normal bulk  Neuro: AOX4, no focal neurological deficits, CN2-12 grossly intact  Psych: Normal mood and affect; appropriate judgment.          Lab Tests/ Studies:    No results found for: ''HGBA1C''     TSH   Date Value Ref Range Status   03/15/2022 2.2 0.3 - 4.7 mcIU/mL Final     Comment:     If applicable TSH pregnancy reference intervals:   First trimester (10-[redacted] weeks gestation): 0.03- 4.0 mcIU/mL  Second trimester (14-[redacted] weeks gestation): 0.19- 4.0 mcIU/mL        Free T4   Date Value Ref Range Status   03/15/2022 1.20 0.80 - 1.70 ng/dL Final     Comment:     If applicable free T4 pregnancy reference intervals:   First trimester (10-[redacted] weeks gestation): 0.9 - 1.4 ng/dL  Second trimester (09-[redacted] weeks gestation): 0.7 - 1.3 ng/dL     Ingestion of high levels of biotin in dietary supplements may lead to falsely increased results.     Vitamin D,25-Hydroxy   Date Value Ref Range Status   03/15/2022 32 20 - 50 ng/mL Final     Comment:     Ingestion of high levels of biotin in dietary supplements may lead to falsely increased results.    Deficiency: less than 12 ng/mL                                                   Inadequate: 12-19 ng/mL                                                       Adequate: 20-50 ng/mL                                                             Potential adverse effects: greater than 50 ng/mL  Imaging/Studies:   N/a            Assessment/Plan:     [name] is a 42 y.o. 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 FSH/LH inappropriately nl c/w hypogonadotropic hypogonadism. Risk factors include prior heroin, alcohol, and marijuana use - has now been sober for 6 months but was sober for 2 months at time of testing. PRL, Ferritin wnl, and patient has not been treating for OSA. Patient is interested in starting anabolic steroids and clomid from friend  -Discussed that I would prefer to monitor/re-check his testosterone levels as he has remained sober to see if his HPG axis recovers naturally  -If remains low, can consider evaluation of OSA or start clomid as needed given patient's c/f fertility  -Discussed with patient that we do not recommend the use of anabolic steroids and discussed risks associated with this    #Fatigue: Workup notable for hypogonadism as above. TFTs, Vit D, otherwise wnl    #Vitamin D Deficiency:   -Continue Vit D3 supplementation.  -Repeat Vit D ordered, goal >30. Last Vit D 32 03/2022      There are no Patient Instructions on file for this visit.       Author: Heath Lark. Clennon Nasca 05/31/2022 3:48 PM    Patient to return to clinic based on lab results       PROBLEM - Moderate complexity   2 or more stable chronic illnesses were addressed during this encounter.  DATA COMPLEXITY - Moderate complexity - review tests, order tests, review notes, indep historian (3/4) OR interpret test OR discuss with specialist [1/3 categories]
